# Patient Record
Sex: Female | Born: 1980 | Race: White | Hispanic: No | Marital: Single | Smoking: Former smoker
Health system: Southern US, Community
[De-identification: ages and names within clinical notes are randomized; demographics above are authoritative.]

## PROBLEM LIST (undated history)

## (undated) DIAGNOSIS — H409 Unspecified glaucoma: Secondary | ICD-10-CM

## (undated) DIAGNOSIS — E28319 Asymptomatic premature menopause: Secondary | ICD-10-CM

## (undated) DIAGNOSIS — J302 Other seasonal allergic rhinitis: Secondary | ICD-10-CM

## (undated) HISTORY — DX: Asymptomatic premature menopause: E28.319

## (undated) HISTORY — DX: Other seasonal allergic rhinitis: J30.2

## (undated) HISTORY — PX: GLAUCOMA REPAIR: SHX214

## (undated) HISTORY — DX: Unspecified glaucoma: H40.9

---
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
## 2000-01-14 ENCOUNTER — Other Ambulatory Visit: Admission: RE | Admit: 2000-01-14 | Discharge: 2000-01-14 | Payer: Self-pay | Admitting: Unknown Physician Specialty

## 2000-01-14 ENCOUNTER — Other Ambulatory Visit: Admission: RE | Admit: 2000-01-14 | Discharge: 2000-01-14 | Payer: Self-pay | Admitting: *Deleted

## 2001-07-16 ENCOUNTER — Other Ambulatory Visit: Admission: RE | Admit: 2001-07-16 | Discharge: 2001-07-16 | Payer: Self-pay | Admitting: Obstetrics and Gynecology

## 2002-03-11 ENCOUNTER — Other Ambulatory Visit: Admission: RE | Admit: 2002-03-11 | Discharge: 2002-03-11 | Payer: Self-pay | Admitting: Obstetrics and Gynecology

## 2002-08-13 ENCOUNTER — Other Ambulatory Visit: Admission: RE | Admit: 2002-08-13 | Discharge: 2002-08-13 | Payer: Self-pay | Admitting: Obstetrics and Gynecology

## 2002-09-10 ENCOUNTER — Other Ambulatory Visit: Admission: RE | Admit: 2002-09-10 | Discharge: 2002-09-10 | Payer: Self-pay | Admitting: Obstetrics and Gynecology

## 2003-03-24 ENCOUNTER — Other Ambulatory Visit: Admission: RE | Admit: 2003-03-24 | Discharge: 2003-03-24 | Payer: Self-pay | Admitting: Obstetrics and Gynecology

## 2004-01-26 ENCOUNTER — Other Ambulatory Visit: Admission: RE | Admit: 2004-01-26 | Discharge: 2004-01-26 | Payer: Self-pay | Admitting: Obstetrics and Gynecology

## 2004-09-02 ENCOUNTER — Encounter: Admission: RE | Admit: 2004-09-02 | Discharge: 2004-09-02 | Payer: Self-pay | Admitting: Family Medicine

## 2005-01-19 ENCOUNTER — Encounter: Admission: RE | Admit: 2005-01-19 | Discharge: 2005-01-19 | Payer: Self-pay | Admitting: Obstetrics and Gynecology

## 2005-02-01 ENCOUNTER — Other Ambulatory Visit: Admission: RE | Admit: 2005-02-01 | Discharge: 2005-02-01 | Payer: Self-pay | Admitting: Obstetrics and Gynecology

## 2005-10-14 ENCOUNTER — Encounter (INDEPENDENT_AMBULATORY_CARE_PROVIDER_SITE_OTHER): Payer: Self-pay | Admitting: Pathology

## 2005-10-14 ENCOUNTER — Ambulatory Visit (HOSPITAL_COMMUNITY): Admission: RE | Admit: 2005-10-14 | Discharge: 2005-10-14 | Payer: Self-pay | Admitting: Obstetrics and Gynecology

## 2006-11-06 ENCOUNTER — Ambulatory Visit: Payer: Self-pay | Admitting: Internal Medicine

## 2010-11-02 NOTE — Assessment & Plan Note (Signed)
[facility]                         GASTROENTEROLOGY OFFICE NOTE   NAME:Diana Leach, Diana Leach                    MRN:          [id number]  DATE:11/06/2006                            DOB:          01/17/1981    GASTROENTEROLOGY CONSULTATION   HISTORY OF PRESENT ILLNESS:  Ms. Below is a very nice 30 year old  patient of Dr. Duaine Dredge who comes today to discuss constipation and  abdominal pain.  For the past several months she has had intermittent  left lower and middle quadrant abdominal pain localized anteriorly  associated with occasional low back pain.  Her bowel habits have changed  from regular to having a bowel movement twice a week or up to three  times a week.  She had problems with constipation at the age of 28 or 49  when she was evaluated by a surgeon for possible appendicitis only to  find out that she had constipation.  After becoming constipated in the  last few months, Makara increased her fiber intake.  She was also  taking stool softener.  Her symptoms were quite uncomfortable, being  bloated, having a lot of gas and low back pain.  Her weight has  increased about 25 pounds in the last four years.  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  She does not have any exercise.  She drinks two cups of coffee a day and a lot of water.  She has  recently changed her diet to what sounds like low fat, low carbohydrate  diet which has had markedly positive effect on her bowel habits. She is  now having almost regular bowel movements since her diet change.  She  also points out that in 2022-10-22 her grandfather died and she was under a  little stress.  She subsequently had a bout of viral gastroenteritis and  this was followed by another two weeks of stress at work.   MEDICATIONS:  1. Dicyclomine 20 mg.  She really has not taken any.  2. Folic acid 1 mg daily.   PAST MEDICAL HISTORY:  Heart rhythm problems as a baby.  Asthmatic  bronchitis.  Allergies.   FAMILY HISTORY:  Positive for irritable bowel syndrome in her sister.  There is no history of inflammatory bowel disease or colon cancer.   SOCIAL HISTORY:  She is single.  She lives with her parents.  She works  as an Airline pilot.  She doesn't smoke and doesn't drink alcohol.  She  stopped smoking in January of 2007.   REVIEW OF SYSTEMS:  Positive for recent fever, allergies, skin rashes,  back pain and swollen lymph nodes in her neck.   PHYSICAL EXAMINATION:  VITAL SIGNS: Blood pressure 116/62, pulse 64 and  weight 154 pounds.  GENERAL APPEARANCE:  She is alert, oriented and in no distress. She  appeared healthy.  HEENT:  Sclerae nonicteric.  Oral cavity is normal.  NECK:  Supple with no lymphadenopathy.  LUNGS:  Clear to auscultation.  CARDIOVASCULAR:  Normal S1 and S2.  ABDOMEN:  Soft, not distended, normal active bowel sounds.  I could not  elicit any tenderness which  she reported previously, especially the left  lower quadrant today was unremarkable.  There was no palpable stool or  mass.  Liver edge was at the costal margin.  RECTAL:  On rectal exam the rectal tone was normal.  She had soft  Hemoccult negative stool in the rectal ampulla.   LABORATORY DATA:  Laboratory data from Dr. Geoffery Lyons office showed  normal hemoglobin at 13, hematocrit 32.8, with normal white cell count.  Her serum albumin was 3.9.   IMPRESSION:  This is a 30 year old white female with functional  constipation which has improved secondary to dietary modification.  I  think she needs to increase her exercise and continue on her  carbohydrate-modified diet which seems to be normalizing her bowel  habits.  I have also suggested that she use Activia Yogurt or  Probiotic's to treat excessive gas and bloating which could be related  to bacterial overgrowth which could be, again, related to constipation.  I don't see any indication here to proceed with further gastrointestinal   evaluation, such as colonoscopy or CT scan because she is asymptomatic  and her physical examination, as well as history, are consistent with a  functional constipation.   PLAN:  I have given Guerline a booklet on constipation, also a booklet on  a high fiber diet and gave her advice as mentioned above.  She will come  to see [place] on a p.r.n. basis.  She will followup with Dr. Duaine Dredge for  her routine medical care.     Hedwig Morton. Juanda Chance, MD  Electronically Signed    DMB/MedQ  DD: 11/06/2006  DT: 11/06/2006  Job #: [id number]

## 2010-11-05 NOTE — Op Note (Signed)
Diana Leach, Diana Leach             ACCOUNT NO.:  [id number]   MEDICAL RECORD NO.:  [id number]          PATIENT TYPE:  AMB   LOCATION:  [facility]                           FACILITY:  [facility]   PHYSICIAN:  Duke Salvia. Marcelle Overlie, M.D.DATE OF BIRTH:  May 30, 1981   DATE OF PROCEDURE:  10/14/2005  DATE OF DISCHARGE:                                 OPERATIVE REPORT   PREOPERATIVE DIAGNOSIS:  Recurrent right vulvar skin lesion.   POSTOPERATIVE DIAGNOSIS:  Recurrent right vulvar skin lesion.   PROCEDURE:  Wide excision of vulvar skin lesion.   SURGEON:  [place].   ANESTHESIA:  General plus local.   SPECIMENS REMOVED:  Vulvar skin biopsy.   ESTIMATED BLOOD LOSS:  Minimal.   PROCEDURE AND FINDINGS:  The patient was taken to the operating room.  Initially MAC and local was started.  Because of patient anxiety and  discomfort, the decision was made to proceed under general.  Once she was  comfortable, the area in question was outlined.  It was just lateral to the  right labia minora.  A flattish whitish plaque area that has been  chronically irritated had been biopsied previously.  This had already been  infiltrated with Marcaine 0.5%.  This was excised in an ellipse and  reapproximated easily with interrupted 3-0 Vicryl repeat suture times seven.  This was hemostatic.  She tolerated this well and went to recovery room in  good condition.      Richard M. Marcelle Overlie, M.D.  Electronically Signed     RMH/MEDQ  D:  10/14/2005  T:  10/14/2005  Job:  [id number]

## 2010-12-06 ENCOUNTER — Ambulatory Visit: Payer: Self-pay | Admitting: Internal Medicine

## 2011-03-01 ENCOUNTER — Ambulatory Visit: Payer: Self-pay | Admitting: Physician Assistant

## 2014-07-16 ENCOUNTER — Other Ambulatory Visit: Payer: Self-pay | Admitting: Obstetrics and Gynecology

## 2014-07-17 LAB — CYTOLOGY - PAP

## 2015-10-10 ENCOUNTER — Emergency Department (HOSPITAL_COMMUNITY)
Admission: EM | Admit: 2015-10-10 | Discharge: 2015-10-11 | Disposition: A | Discharge status: Home / Self Care | Payer: BLUE CROSS/BLUE SHIELD | Attending: Emergency Medicine | Admitting: Emergency Medicine

## 2015-10-10 ENCOUNTER — Emergency Department (HOSPITAL_COMMUNITY): Payer: BLUE CROSS/BLUE SHIELD

## 2015-10-10 ENCOUNTER — Encounter (HOSPITAL_COMMUNITY): Payer: Self-pay

## 2015-10-10 DIAGNOSIS — Z3202 Encounter for pregnancy test, result negative: Secondary | ICD-10-CM | POA: Diagnosis not present

## 2015-10-10 DIAGNOSIS — Z23 Encounter for immunization: Secondary | ICD-10-CM | POA: Insufficient documentation

## 2015-10-10 DIAGNOSIS — S51012A Laceration without foreign body of left elbow, initial encounter: Secondary | ICD-10-CM | POA: Insufficient documentation

## 2015-10-10 DIAGNOSIS — W540XXA Bitten by dog, initial encounter: Secondary | ICD-10-CM | POA: Insufficient documentation

## 2015-10-10 DIAGNOSIS — S30811A Abrasion of abdominal wall, initial encounter: Secondary | ICD-10-CM | POA: Insufficient documentation

## 2015-10-10 DIAGNOSIS — Y998 Other external cause status: Secondary | ICD-10-CM | POA: Diagnosis not present

## 2015-10-10 DIAGNOSIS — S50312A Abrasion of left elbow, initial encounter: Secondary | ICD-10-CM | POA: Diagnosis not present

## 2015-10-10 DIAGNOSIS — Y9389 Activity, other specified: Secondary | ICD-10-CM | POA: Diagnosis not present

## 2015-10-10 DIAGNOSIS — Y92009 Unspecified place in unspecified non-institutional (private) residence as the place of occurrence of the external cause: Secondary | ICD-10-CM | POA: Diagnosis not present

## 2015-10-10 DIAGNOSIS — S51052A Open bite, left elbow, initial encounter: Secondary | ICD-10-CM | POA: Insufficient documentation

## 2015-10-10 DIAGNOSIS — Z87891 Personal history of nicotine dependence: Secondary | ICD-10-CM | POA: Diagnosis not present

## 2015-10-10 DIAGNOSIS — S40212A Abrasion of left shoulder, initial encounter: Secondary | ICD-10-CM | POA: Insufficient documentation

## 2015-10-10 LAB — I-STAT BETA HCG BLOOD, ED (MC, WL, AP ONLY): I-stat hCG, quantitative: <5 m[IU]/mL (ref <5)

## 2015-10-10 MED ORDER — AMPICILLIN-SULBACTAM SODIUM 3 (2-1) G IJ SOLR
3.0000 g | Freq: Once | INTRAMUSCULAR | Status: AC
  Administered 2015-10-10: 3 g via INTRAVENOUS
  Filled 2015-10-10: qty 3

## 2015-10-10 MED ORDER — TETANUS-DIPHTH-ACELL PERTUSSIS 5-2.5-18.5 LF-MCG/0.5 IM SUSP
0.5000 mL | Freq: Once | INTRAMUSCULAR | Status: AC
  Administered 2015-10-10: 0.5 mL via INTRAMUSCULAR
  Filled 2015-10-10: qty 0.5

## 2015-10-10 MED ORDER — MORPHINE SULFATE (PF) 4 MG/ML IV SOLN
4.0000 mg | Freq: Once | INTRAVENOUS | Status: AC
  Administered 2015-10-10: 4 mg via INTRAVENOUS
  Filled 2015-10-10: qty 1

## 2015-10-10 MED ORDER — OXYCODONE-ACETAMINOPHEN 5-325 MG PO TABS
1.0000 | ORAL_TABLET | ORAL | Status: DC | PRN
  Administered 2015-10-10: 1 via ORAL
  Filled 2015-10-10: qty 1

## 2015-10-10 MED ORDER — FENTANYL CITRATE (PF) 100 MCG/2ML IJ SOLN
50.0000 ug | INTRAMUSCULAR | Status: DC | PRN
  Administered 2015-10-10: 50 ug via INTRAVENOUS
  Filled 2015-10-10: qty 2

## 2015-10-10 NOTE — ED Provider Notes (Addendum)
CSN: [id number]     Arrival date & time 10/10/15  1731 History   First MD Initiated Contact with Patient 10/10/15 1732     Chief Complaint  Patient presents with  . Animal Bite     (Consider location/radiation/quality/duration/timing/severity/associated sxs/prior Treatment) HPI Comments: 35 y.o. Female presents following a dog bite.  The patient moved into a new house and her neighbor cut her grass and she went to his house to thank him and was attacked by his pitt bull.  Pitt bull is up to date with vaccines.  Patient last had a tetanus shot many years ago.    Patient is a 35 y.o. female presenting with animal bite.  Animal Bite Associated symptoms: no fever and no numbness     No past medical history on file. No past surgical history on file. No family history on file. Social History  Substance Use Topics  . Smoking status: Former Smoker    Types: Cigarettes  . Smokeless tobacco: Not on file  . Alcohol Use: No   OB History    No data available     Review of Systems  Constitutional: Negative for fever and chills.  HENT: Negative for congestion and rhinorrhea.   Eyes: Negative for pain and redness.  Respiratory: Negative for cough and shortness of breath.   Cardiovascular: Negative for chest pain and palpitations.  Gastrointestinal: Negative for nausea, vomiting, abdominal pain and diarrhea.  Genitourinary: Negative for dysuria, urgency and hematuria.  Musculoskeletal: Positive for myalgias. Negative for back pain.  Skin: Positive for wound (multiple).  Neurological: Negative for dizziness, weakness and numbness.  Hematological: Does not bruise/bleed easily.      Allergies  Erythromycin  Home Medications   Prior to Admission medications   Medication Sig Start Date End Date Taking? Authorizing Provider  cyclobenzaprine (FLEXERIL) 5 MG tablet Take 5 mg by mouth 3 (three) times daily as needed for muscle spasms.   Yes Historical Provider, MD  naproxen (NAPROSYN)  500 MG tablet Take 500 mg by mouth daily as needed for moderate pain.  09/24/15  Yes Historical Provider, MD  amoxicillin-clavulanate (AUGMENTIN) 875-125 MG tablet Take 1 tablet by mouth every 12 (twelve) hours. 10/11/15 10/21/15  Leta BaptistEmily Roe Ayaansh Smail, MD  bacitracin ointment Apply 1 application topically 2 (two) times daily. 10/11/15   Leta BaptistEmily Roe Merdith Boyd, MD  oxyCODONE-acetaminophen (PERCOCET/ROXICET) 5-325 MG tablet Take 1 tablet by mouth every 6 (six) hours as needed for severe pain. 10/11/15   Leta BaptistEmily Roe Guilford Shannahan, MD   BP 114/79 mmHg  Pulse 82  Temp(Src) 97.5 F (36.4 C) (Oral)  Resp 16  Ht 5\' 6"  (1.676 m)  Wt 158 lb (71.668 kg)  BMI 25.51 kg/m2  SpO2 98% Physical Exam  Constitutional: She is oriented to person, place, and time. She appears well-developed and well-nourished. No distress.  HENT:  Head: Normocephalic and atraumatic.  Right Ear: External ear normal.  Left Ear: External ear normal.  Nose: Nose normal.  Mouth/Throat: Oropharynx is clear and moist. No oropharyngeal exudate.  Eyes: EOM are normal. Pupils are equal, round, and reactive to light.  Neck: Normal range of motion. Neck supple.  Cardiovascular: Normal rate, regular rhythm, normal heart sounds and intact distal pulses.   No murmur heard. Pulmonary/Chest: Effort normal. No respiratory distress. She has no wheezes. She has no rales.  Abdominal: Soft. She exhibits no distension. There is no tenderness.  Musculoskeletal: Normal range of motion. She exhibits edema (around the left elbow). She exhibits no tenderness.  Left elbow: She exhibits laceration. She exhibits normal range of motion, no effusion and no deformity. No tenderness found.  Neurological: She is alert and oriented to person, place, and time. She has normal strength. No sensory deficit.  Skin: Skin is warm and dry. No rash noted. She is not diaphoretic.     Multiple teeth marks and abrasions over the patient's body as depicted in the diagram. Most notably on  the back of the left shoulder, around the left elbow, at the right groin.  Vitals reviewed.   ED Course  .Marland KitchenLaceration Repair Date/Time: 10/10/2015 11:41 PM Performed by: Tyrone Apple ROE Authorized by: Leta Baptist Consent: Verbal consent obtained. Risks and benefits: risks, benefits and alternatives were discussed Consent given by: patient Body area: upper extremity Location details: left elbow Laceration length: 1 cm Foreign bodies: no foreign bodies Tendon involvement: none Nerve involvement: none Vascular damage: no Anesthesia: local infiltration Local anesthetic: lidocaine 1% without epinephrine Irrigation solution: saline Irrigation method: syringe Skin closure: 4-0 nylon Number of sutures: 1 Approximation difficulty: simple Patient tolerance: Patient tolerated the procedure well with no immediate complications  .Marland KitchenLaceration Repair Date/Time: 10/10/2015 11:43 PM Performed by: Tyrone Apple ROE Authorized by: Leta Baptist Consent: Verbal consent obtained. Risks and benefits: risks, benefits and alternatives were discussed Consent given by: patient Body area: upper extremity Location details: left elbow Laceration length: 1.5 cm Foreign bodies: no foreign bodies Tendon involvement: none Nerve involvement: none Vascular damage: no Anesthesia: local infiltration Local anesthetic: lidocaine 1% without epinephrine Patient sedated: no Irrigation solution: saline Irrigation method: syringe Skin closure: 4-0 nylon Number of sutures: 2 Technique: simple Approximation difficulty: simple Patient tolerance: Patient tolerated the procedure well with no immediate complications   (including critical care time) Labs Review Labs Reviewed  I-STAT BETA HCG BLOOD, ED ([facility], [facility], [facility] ONLY)    Imaging Review Dg Elbow Complete Left  10/10/2015  CLINICAL DATA:  Dog bite to left elbow, multiple lacerations EXAM: LEFT ELBOW - COMPLETE 3+ VIEW COMPARISON:  None. FINDINGS:  No fracture or dislocation is seen. The joint spaces are preserved. Mild soft tissue swelling/lucencies along the radial aspect of the elbow, likely corresponding to known lacerations. No radiopaque foreign body is seen. IMPRESSION: Soft tissue swelling/lacerations along the radial aspect of the elbow. No fracture, dislocation, or radiopaque foreign body is seen. Electronically Signed   By: Charline Bills M.D.   On: 10/10/2015 22:17   I have personally reviewed and evaluated these images and lab results as part of my medical decision-making.   EKG Interpretation None      MDM  Patient was seen and evaluated in stable condition. Patient was given booster ask for tetanus vaccination. Patient was given Percocet for pain control. Patient was also given a dose of IV Unasyn for antibiotic prophylactic. Patient with multiple abrasions over her body as well as lacerations. There were 2 larger gaping lacerations about her elbow. A single stitch was put in one to approximate the skin but to leave open able to drain. 2 stitches were put into the other one. It was discussed with patient that closing lacerations especially closing them tightly after animal bite puts you at increased risk for infection she had wanted these to to be better approximated. Patient was discharged home in stable condition with prescriptions for Augmentin, Percocet, bacitracin. She was instructed to follow-up in 5-7 days with the primary care physician for wound recheck and suture removal. She was given strict return precautions. Final diagnoses:  Dog  bite    1. Dog bite    Leta Baptist, MD 10/11/15 1610  Leta Baptist, MD 10/11/15 [phone]

## 2015-10-10 NOTE — ED Notes (Signed)
Cleaned pt.'s wounds on left arm, back of left shoulder, and right groin. Pt. Is resting on stretcher with no signs of distress.

## 2015-10-10 NOTE — ED Notes (Signed)
Pt. Coming from home via [facility] c/o dog bite today. Pt. Going to neighbors house and his 2 pitbulls ran up and attacked her. Pt. Noted to have puncture marks to left elbow and forearm and abrasions to left flank area and right hip area. Pt. sts her pain is 6/10. Pt. Tearful at this time. Pt. Given ns en route. Pt. Denies LOC or hitting her head.

## 2015-10-11 MED ORDER — LIDOCAINE HCL (PF) 1 % IJ SOLN
10.0000 mL | Freq: Once | INTRAMUSCULAR | Status: AC
  Administered 2015-10-11: 10 mL
  Filled 2015-10-11: qty 10

## 2015-10-11 MED ORDER — AMOXICILLIN-POT CLAVULANATE 875-125 MG PO TABS: 1.0000 | ORAL_TABLET | Freq: Two times a day (BID) | ORAL | Status: AC

## 2015-10-11 MED ORDER — BACITRACIN ZINC 500 UNIT/GM EX OINT: 1.0000 "application " | TOPICAL_OINTMENT | Freq: Two times a day (BID) | CUTANEOUS | Status: DC

## 2015-10-11 MED ORDER — OXYCODONE-ACETAMINOPHEN 5-325 MG PO TABS: 1.0000 | ORAL_TABLET | Freq: Four times a day (QID) | ORAL | Status: DC | PRN

## 2015-10-11 NOTE — Discharge Instructions (Signed)
You were seen and evaluated today for your multiple abrasions and bite wounds. Keep the areas clean and dry. Use water and regular soap. Keep your wounds dressed. Follow-up outpatient for wound check and suture removal in 5-7 days. Seek immediate medical attention if the areas start to appear infected. Take the antibiotic prescribed. Keep your left arm elevated and apply ice to the elbow area.  Animal Bite Animal bites can range from mild to serious. An animal bite can result in a scratch on the skin, a deep open cut, a puncture of the skin, a crush injury, or tearing away of the skin or a body part. A small bite from a house pet will usually not cause serious problems. However, some animal bites can become infected or injure a bone or other tissue.  Bites from certain animals can be more dangerous because of the risk of spreading rabies, which is a serious viral infection. This risk is higher with bites from stray animals or wild animals, such as raccoons, foxes, skunks, and bats. Dogs are responsible for most animal bites. Children are bitten more often than adults. SYMPTOMS  Common symptoms of an animal bite include:   Pain.   Bleeding.   Swelling.   Bruising.  DIAGNOSIS  This condition may be diagnosed based on a physical exam and medical history. Your health care provider will examine the wound and ask for details about the animal and how the bite happened. You may also have tests, such as:   Blood tests to check for infection or to determine if surgery is needed.  X-rays to check for damage to bones or joints.  Culture test. This uses a sample of fluid from the wound to check for infection. TREATMENT  Treatment varies depending on the location and type of animal bite and your medical history. Treatment may include:   Wound care. This often includes cleaning the wound, flushing the wound with saline solution, and applying a bandage (dressing). Sometimes, the wound is left open to  heal because of the high risk of infection. However, in some cases, the wound may be closed with stitches (sutures), staples, skin glue, or adhesive strips.   Antibiotic medicine.   Tetanus shot.   Rabies treatment if the animal could have rabies.  In some cases, bites that have become infected may require IV antibiotics and surgical treatment in the hospital.  HOME CARE INSTRUCTIONS Wound Care  Follow instructions from your health care provider about how to take care of your wound. Make sure you:  Wash your hands with soap and water before you change your dressing. If soap and water are not available, use hand sanitizer.  Change your dressing as told by your health care provider.  Leave sutures, skin glue, or adhesive strips in place. These skin closures may need to be in place for 2 weeks or longer. If adhesive strip edges start to loosen and curl up, you may trim the loose edges. Do not remove adhesive strips completely unless your health care provider tells you to do that.  Check your wound every day for signs of infection. Watch for:   Increasing redness, swelling, or pain.   Fluid, blood, or pus.  General Instructions  Take or apply over-the-counter and prescription medicines only as told by your health care provider.   If you were prescribed an antibiotic, take or apply it as told by your health care provider. Do not stop using the antibiotic even if your condition improves.  Keep the injured area raised (elevated) above the level of your heart while you are sitting or lying down, if this is possible.   If directed, apply ice to the injured area.   Put ice in a plastic bag.   Place a towel between your skin and the bag.   Leave the ice on for 20 minutes, 2-3 times per day.   Keep all follow-up visits as told by your health care provider. This is important.  SEEK MEDICAL CARE IF:  You have increasing redness, swelling, or pain at the site of  your wound.   You have a general feeling of sickness (malaise).   You feel nauseous or you vomit.   You have pain that does not get better.  SEEK IMMEDIATE MEDICAL CARE IF:  You have a red streak extending away from your wound.   You have fluid, blood, or pus coming from your wound.   You have a fever or chills.   You have trouble moving your injured area.   You have numbness or tingling extending beyond the wound.   This information is not intended to replace advice given to you by your health care provider. Make sure you discuss any questions you have with your health care provider.   Document Released: 02/22/2011 Document Revised: 02/25/2015 Document Reviewed: 10/22/2014 Elsevier Interactive Patient Education [employer]2016 [employer]vier Inc.

## 2015-10-11 NOTE — ED Notes (Signed)
Lidocaine readily available, suture cart also for MD.

## 2016-05-09 ENCOUNTER — Ambulatory Visit (INDEPENDENT_AMBULATORY_CARE_PROVIDER_SITE_OTHER): Payer: Self-pay

## 2016-05-09 ENCOUNTER — Encounter (INDEPENDENT_AMBULATORY_CARE_PROVIDER_SITE_OTHER): Payer: Self-pay | Admitting: Orthopaedic Surgery

## 2016-05-09 ENCOUNTER — Ambulatory Visit (INDEPENDENT_AMBULATORY_CARE_PROVIDER_SITE_OTHER): Payer: BLUE CROSS/BLUE SHIELD | Admitting: Orthopaedic Surgery

## 2016-05-09 DIAGNOSIS — G8929 Other chronic pain: Secondary | ICD-10-CM

## 2016-05-09 DIAGNOSIS — M25512 Pain in left shoulder: Secondary | ICD-10-CM

## 2016-05-09 DIAGNOSIS — M25511 Pain in right shoulder: Secondary | ICD-10-CM | POA: Diagnosis not present

## 2016-05-09 DIAGNOSIS — M25522 Pain in left elbow: Secondary | ICD-10-CM

## 2016-05-09 DIAGNOSIS — M25521 Pain in right elbow: Secondary | ICD-10-CM | POA: Diagnosis not present

## 2016-05-09 MED ORDER — METHYLPREDNISOLONE ACETATE 40 MG/ML IJ SUSP
40.0000 mg | INTRAMUSCULAR | Status: AC | PRN
  Administered 2016-05-09: 40 mg via INTRA_ARTICULAR

## 2016-05-09 MED ORDER — LIDOCAINE HCL 1 % IJ SOLN
1.0000 mL | INTRAMUSCULAR | Status: AC | PRN
  Administered 2016-05-09: 1 mL

## 2016-05-09 MED ORDER — METHYLPREDNISOLONE ACETATE 40 MG/ML IJ SUSP
40.0000 mg | INTRAMUSCULAR | Status: AC | PRN
  Administered 2016-05-09: 40 mg

## 2016-05-09 NOTE — Addendum Note (Signed)
Addended by: Rogers SeedsYEATTS, Fedora Knisely M on: 05/09/2016 05:37 PM   Modules accepted: Orders

## 2016-05-09 NOTE — Progress Notes (Signed)
Office Visit Note   Patient: Marlaine HindBrendle E Boshers           Date of Birth: 11/24/1980           MRN: [id number] Visit Date: 05/09/2016              Requested by: Martha ClanWilliam Shaw, MD [address] [place], [place] [id number] PCP: No PCP Per Patient   Assessment & Plan: Visit Diagnoses:  1. Chronic right shoulder pain   2. Pain in right elbow   3. Chronic left shoulder pain   4. Pain in left elbow     Plan: Tolerated the subacromial injection in her left shoulder and the injection in her left lateral epicondylar area quite well. Given the pain and weakness in her left shoulder combined with some slight muscle atrophy and MRI is definitely warranted of the shoulder to rule out a rotator cuff tear. Even the extent of her attack on that left upper extremity combined with continued pain after 7 months post trauma and due to weakness in her shoulder and MRI is warranted to rule out a rotator cuff tear. We will see her back after the MRI.  Follow-Up Instructions: Return in about 4 weeks (around 06/06/2016).   Orders:  Orders Placed This Encounter  Procedures  . Large Joint Injection/Arthrocentesis  . Hand/Upper Extremity Injection/Arthrocentesis  . XR Shoulder Left  . XR Elbow 2 Views Left   No orders of the defined types were placed in this encounter.     Procedures: Large Joint Inj Date/Time: 05/09/2016 5:19 PM Performed by: Kathryne HitchBLACKMAN, CHRISTOPHER Y Authorized by: Doneen PoissonBLACKMAN, CHRISTOPHER Y   Indications:  Pain Location:  Shoulder Site:  L subacromial bursa Medications:  1 mL lidocaine 1 %; 40 mg methylPREDNISolone acetate 40 MG/ML Hand/UE Inj Date/Time: 05/09/2016 5:20 PM Performed by: Kathryne HitchBLACKMAN, CHRISTOPHER Y Authorized by: Kathryne HitchBLACKMAN, CHRISTOPHER Y   Indications:  Pain Condition: lateral epicondylitis   Site:  L elbow Approach:  Lateral Medications:  1 mL lidocaine 1 %; 40 mg methylPREDNISolone acetate 40 MG/ML     Clinical Data: No additional  findings.   Subjective: Chief Complaint  Patient presents with  . Left Shoulder - Pain  . Left Elbow - Pain    Georgiana SpinnerBrendle is here for her left shoulder and left elbow pain. 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  As far shoulder she is having residual pain, and in elbow she has residual pain, swelling and inflammation it.  Per Dr. Clelia CroftShaw he wanted to be seen in our office since this has been on going. +6  This pain does wake her up at night and she has considerable weakness she feels her entire left upper extremity. It's been over 6 months since her original trauma  Review of Systems Negative for headache, chest pain, shortness of breath, fever, chills, nausea, vomiting  Objective: Vital Signs: LMP 05/03/2016   Physical Exam He is alert and oriented 3 in no acute distress Ortho Exam Emanation of her left shoulder shows some slight atrophy in the posterior muscles of the shoulder but is only slight. She has obvious bite marks from the trauma that was done months ago. She has pain with external rotation internal rotation with adduction and abduction of her shoulder and her some clicking in the shoulder itself. She also has pain over the left elbow lateral epicondylar area and multiple bite wounds on the lower arm and forearm as well. Specialty  Comments:  No specialty comments available.  Imaging: Xr Elbow 2 Views Left  Result Date: 05/09/2016 An AP and lateral of the left elbow shows a normal-appearing elbow joint with no acute bone irregularities  Xr Shoulder Left  Result Date: 05/09/2016 An AP and lateral of her left shoulder including oblique view showed no abnormal findings.    PMFS History: There are no active problems to display for this patient.  No past medical history on file.  No family history on file.  No past surgical history on file. Social History   Occupational History  . Not on file.   Social History Main Topics   . Smoking status: Former Smoker    Types: Cigarettes  . Smokeless tobacco: Not on file  . Alcohol use No  . Drug use: Unknown  . Sexual activity: Not on file

## 2016-05-30 ENCOUNTER — Ambulatory Visit
Admission: RE | Admit: 2016-05-30 | Discharge: 2016-05-30 | Disposition: A | Discharge status: Home / Self Care | Payer: BLUE CROSS/BLUE SHIELD | Source: Ambulatory Visit | Attending: Orthopaedic Surgery | Admitting: Orthopaedic Surgery

## 2016-05-30 DIAGNOSIS — M25512 Pain in left shoulder: Principal | ICD-10-CM

## 2016-05-30 DIAGNOSIS — G8929 Other chronic pain: Secondary | ICD-10-CM

## 2016-06-06 ENCOUNTER — Ambulatory Visit (INDEPENDENT_AMBULATORY_CARE_PROVIDER_SITE_OTHER): Payer: BLUE CROSS/BLUE SHIELD | Admitting: Orthopaedic Surgery

## 2016-06-06 DIAGNOSIS — G8929 Other chronic pain: Secondary | ICD-10-CM | POA: Diagnosis not present

## 2016-06-06 DIAGNOSIS — M25512 Pain in left shoulder: Secondary | ICD-10-CM | POA: Diagnosis not present

## 2016-06-06 NOTE — Progress Notes (Signed)
The patient is following up for review of an MRI of her left shoulder. We also provided a steroid injection subacromial space and her shoulder at her last visit. She is someone who was attacked by a dog and pulled down that left arm in April 2017. She's been having problems in her shoulder and elbow. I injected both areas and she said the injection helped about 70% she's feeling much better overall. However I did obtain an MRI cholesterol weakness in the shoulder muscles and shoulder girdle in general.  The MRI is reviewed with her and it does show atrophy of the teres minor muscle suggesting denervation type of injury. This is consistent with the trauma that she describes. She otherwise has excellent range of motion of her shoulder exam today and some mild pain.  At this point I told her that most likely she'll have complete resolution of her symptoms. I told her we can always inject her shoulder again in the spring if is bothering her enough. She'll let us know he gives a call if it becomes an issue for her.

## 2016-08-22 ENCOUNTER — Ambulatory Visit (INDEPENDENT_AMBULATORY_CARE_PROVIDER_SITE_OTHER): Payer: BLUE CROSS/BLUE SHIELD | Admitting: Orthopaedic Surgery

## 2016-08-22 DIAGNOSIS — M7712 Lateral epicondylitis, left elbow: Secondary | ICD-10-CM | POA: Diagnosis not present

## 2016-08-22 DIAGNOSIS — G8929 Other chronic pain: Secondary | ICD-10-CM

## 2016-08-22 DIAGNOSIS — M25512 Pain in left shoulder: Secondary | ICD-10-CM | POA: Diagnosis not present

## 2016-08-22 MED ORDER — METHYLPREDNISOLONE ACETATE 40 MG/ML IJ SUSP
40.0000 mg | INTRAMUSCULAR | Status: AC | PRN
  Administered 2016-08-22: 40 mg

## 2016-08-22 MED ORDER — LIDOCAINE HCL 1 % IJ SOLN
3.0000 mL | INTRAMUSCULAR | Status: AC | PRN
  Administered 2016-08-22: 3 mL

## 2016-08-22 MED ORDER — METHYLPREDNISOLONE ACETATE 40 MG/ML IJ SUSP
40.0000 mg | INTRAMUSCULAR | Status: AC | PRN
  Administered 2016-08-22: 40 mg via INTRA_ARTICULAR

## 2016-08-22 NOTE — Progress Notes (Signed)
   Office Visit Note   Patient: Diana Leach           Date of Birth: Sep 17, 1980           MRN: [id number] Visit Date: 08/22/2016              Requested by: No referring provider defined for this encounter. PCP: No PCP Per Patient   Assessment & Plan: Visit Diagnoses:  1. Chronic left shoulder pain   2. Lateral epicondylitis, left elbow     Plan: She tolerated injections in her left shoulder subacromial space and her left elbow epicondylar area very well. These were both steroid and lidocaine. She'll work on stretching exercises and she does have a topical anti-inflammatory on she hasn't used. I instructed her to how to use these. She'll follow-up as needed  Follow-Up Instructions: Return if symptoms worsen or fail to improve.   Orders:  Orders Placed This Encounter  Procedures  . Large Joint Injection/Arthrocentesis  . Hand/Upper Extremity Injection/Arthrocentesis   No orders of the defined types were placed in this encounter.     Procedures: Large Joint Inj Date/Time: 08/22/2016 3:34 PM Performed by: Kathryne HitchBLACKMAN, Iara Monds Y Authorized by: Doneen PoissonBLACKMAN, Ansley Mangiapane Y   Location:  Shoulder Site:  L subacromial bursa Ultrasound Guidance: No   Fluoroscopic Guidance: No   Arthrogram: No   Medications:  3 mL lidocaine 1 %; 40 mg methylPREDNISolone acetate 40 MG/ML Hand/UE Inj Date/Time: 08/22/2016 3:34 PM Performed by: Kathryne HitchBLACKMAN, Kaylib Furness Y Authorized by: Kathryne HitchBLACKMAN, Jaylee Freeze Y   Condition: lateral epicondylitis   Medications:  40 mg methylPREDNISolone acetate 40 MG/ML     Clinical Data: No additional findings.   Subjective: No chief complaint on file. The patient is well-known to me. She is following up after injuries to her left shoulder and left elbow from a dog bite that occurred in April 2017. We did obtain an MRI of her shoulder showed some atrophy of the teres minor muscle. Both her shoulder and her elbow been bothering her some the elbows get lateral  epicondylitis but this is around where she had bites as well. Injections helped quite a bit but she like to have them again today because the elbow is really flared up more so the shoulder but she feels like she is making great progress  HPI  Review of Systems She denies any fever chills or nausea vomiting or any recent illnesses  Objective: Vital Signs: There were no vitals taken for this visit.  Physical Exam He is alert and oriented 3 in no acute distress Ortho Exam Examination of her left elbow shows well-healed dog bite wounds. She has pain over the lateral epicondyle and the ECRB. Her left shoulder has fluid range of motion which is a little bit of pain in the subacromial area. Specialty Comments:  No specialty comments available.  Imaging: No results found.   PMFS History: Patient Active Problem List   Diagnosis Date Noted  . Chronic left shoulder pain 08/22/2016  . Lateral epicondylitis, left elbow 08/22/2016   No past medical history on file.  No family history on file.  No past surgical history on file. Social History   Occupational History  . Not on file.   Social History Main Topics  . Smoking status: Former Smoker    Types: Cigarettes  . Smokeless tobacco: Not on file  . Alcohol use No  . Drug use: Unknown  . Sexual activity: Not on file

## 2016-09-09 ENCOUNTER — Telehealth (INDEPENDENT_AMBULATORY_CARE_PROVIDER_SITE_OTHER): Payer: Self-pay | Admitting: Orthopaedic Surgery

## 2016-09-09 NOTE — Telephone Encounter (Signed)
PT IS ASKING IF WE CAN GIVE HER A REFERRAL FOR PT TO STRENGTHEN HER LEFT ARM.  [phone]

## 2016-09-12 NOTE — Telephone Encounter (Signed)
Please advise 

## 2016-09-12 NOTE — Telephone Encounter (Signed)
Faxed to [facility], they will call patient and schedule

## 2016-09-12 NOTE — Telephone Encounter (Signed)
See script for outpatient PT

## 2016-11-01 ENCOUNTER — Ambulatory Visit (INDEPENDENT_AMBULATORY_CARE_PROVIDER_SITE_OTHER): Payer: BLUE CROSS/BLUE SHIELD | Admitting: Orthopaedic Surgery

## 2016-11-01 ENCOUNTER — Other Ambulatory Visit (INDEPENDENT_AMBULATORY_CARE_PROVIDER_SITE_OTHER): Payer: Self-pay

## 2016-11-01 DIAGNOSIS — M25522 Pain in left elbow: Secondary | ICD-10-CM

## 2016-11-01 DIAGNOSIS — M7712 Lateral epicondylitis, left elbow: Secondary | ICD-10-CM | POA: Diagnosis not present

## 2016-11-01 NOTE — Progress Notes (Signed)
The patient is well-known to me. She had significant left upper extremity trauma from a dog bite last year. After the dog bite healed she had problems with lateral epicondylitis of the elbow. We've injected that area twice. She comes in today because and unfortunately she's had adverse reaction to steroid injections.  On examination she actually has skin changes consistent with steroid injection in but there is also atrophy in this area almost as if there is adipose tissue necrosis. She has full range of motion of her elbow. She is tender over the lateral epicondylar area but there is no evidence infection. She can flex and extend her wrist easily and she has full strength around the elbow and wrist muscles.  I would like to MRI the elbow at this point to assess the extent of atrophy and the anatomy in this elbow though area in general. This is again on the left side. I gave her reassurance that uses treatment is just observation but is concerning enough to obtain an MRI.

## 2016-11-02 ENCOUNTER — Telehealth (INDEPENDENT_AMBULATORY_CARE_PROVIDER_SITE_OTHER): Payer: Self-pay | Admitting: Orthopaedic Surgery

## 2016-11-02 NOTE — Telephone Encounter (Signed)
Tresa EndoKelly @ Dr. Alver FisherShaw's office called for ov notes. I faxed to her [phone]. Ph# [phone]

## 2016-11-15 DIAGNOSIS — H40033 Anatomical narrow angle, bilateral: Secondary | ICD-10-CM | POA: Diagnosis not present

## 2016-11-15 DIAGNOSIS — H5202 Hypermetropia, left eye: Secondary | ICD-10-CM | POA: Diagnosis not present

## 2016-11-15 DIAGNOSIS — H5211 Myopia, right eye: Secondary | ICD-10-CM | POA: Diagnosis not present

## 2016-11-16 ENCOUNTER — Ambulatory Visit
Admission: RE | Admit: 2016-11-16 | Discharge: 2016-11-16 | Disposition: A | Discharge status: Home / Self Care | Payer: BLUE CROSS/BLUE SHIELD | Source: Ambulatory Visit | Attending: Orthopaedic Surgery | Admitting: Orthopaedic Surgery

## 2016-11-16 DIAGNOSIS — M25522 Pain in left elbow: Secondary | ICD-10-CM

## 2016-11-18 DIAGNOSIS — H40003 Preglaucoma, unspecified, bilateral: Secondary | ICD-10-CM | POA: Diagnosis not present

## 2016-11-21 ENCOUNTER — Ambulatory Visit (INDEPENDENT_AMBULATORY_CARE_PROVIDER_SITE_OTHER): Payer: BLUE CROSS/BLUE SHIELD | Admitting: Orthopaedic Surgery

## 2016-11-21 ENCOUNTER — Encounter (INDEPENDENT_AMBULATORY_CARE_PROVIDER_SITE_OTHER): Payer: Self-pay | Admitting: Orthopaedic Surgery

## 2016-11-21 DIAGNOSIS — M7712 Lateral epicondylitis, left elbow: Secondary | ICD-10-CM

## 2016-11-21 NOTE — Progress Notes (Signed)
The patient is very pleasant 36 year old female that we have been seeing for left upper extremity trauma is related to attack by a dog. She had several bite wounds around her elbow and forearm area but no significant damage to the soft tissue that we can tell on clinical exam and no evidence infection. After some time we eventually diagnosed her with lateral epicondylitis as well and felt that a steroid injection would be helpful. We placed a steroid injection without difficulty and eventually over time place a second injection. Following the second injection she did have a reaction there was definitely adverse. She developed atrophy of the soft tissue around the lateral epicondylar area and some changes to the skin. She is following up today after having an MRI of his elbow is only sure there was no significant damage to the elbow in general.  On examination there is still evidence of atrophy of the soft tissue but the skin appears to be slowly improving. She's got excellent function of the elbow is still some slight tenderness. MRI is interesting for the fact that it does show adipose tissue necrosis in the area of the injection. All the remaining structures including muscle, tendon, ligaments and bone appeared normal. This is a finding that is definitely consistent with atrophy the can be caused by a steroid injection although it is significantly rare.  I did let her know that the literature demonstrates that recovery can be had a situation such as this but he can take 6 months to a year. She understands this fully and all questions were encouraged and answered. I would like to see her back in a year for repeat exam. I would definitely not place a steroid injection in this area again but I do feel that a steroid injection if ever needed in any other area of her body would likely not cause the same  response.

## 2016-11-22 ENCOUNTER — Telehealth (INDEPENDENT_AMBULATORY_CARE_PROVIDER_SITE_OTHER): Payer: Self-pay | Admitting: Orthopaedic Surgery

## 2016-11-22 NOTE — Telephone Encounter (Signed)
Copy of records mailed to patient. Also, faxed copy to Dr. Clelia CroftShaw

## 2016-12-02 DIAGNOSIS — H40003 Preglaucoma, unspecified, bilateral: Secondary | ICD-10-CM | POA: Diagnosis not present

## 2016-12-23 DIAGNOSIS — S46812A Strain of other muscles, fascia and tendons at shoulder and upper arm level, left arm, initial encounter: Secondary | ICD-10-CM | POA: Diagnosis not present

## 2017-01-30 DIAGNOSIS — F4321 Adjustment disorder with depressed mood: Secondary | ICD-10-CM | POA: Diagnosis not present

## 2017-02-06 DIAGNOSIS — F4321 Adjustment disorder with depressed mood: Secondary | ICD-10-CM | POA: Diagnosis not present

## 2017-02-22 DIAGNOSIS — F4321 Adjustment disorder with depressed mood: Secondary | ICD-10-CM | POA: Diagnosis not present

## 2017-03-10 DIAGNOSIS — F4321 Adjustment disorder with depressed mood: Secondary | ICD-10-CM | POA: Diagnosis not present

## 2017-03-24 DIAGNOSIS — F4321 Adjustment disorder with depressed mood: Secondary | ICD-10-CM | POA: Diagnosis not present

## 2017-04-05 DIAGNOSIS — F4321 Adjustment disorder with depressed mood: Secondary | ICD-10-CM | POA: Diagnosis not present

## 2017-04-21 DIAGNOSIS — F4321 Adjustment disorder with depressed mood: Secondary | ICD-10-CM | POA: Diagnosis not present

## 2017-05-05 DIAGNOSIS — F4321 Adjustment disorder with depressed mood: Secondary | ICD-10-CM | POA: Diagnosis not present

## 2017-05-15 DIAGNOSIS — F4321 Adjustment disorder with depressed mood: Secondary | ICD-10-CM | POA: Diagnosis not present

## 2017-06-02 DIAGNOSIS — F4321 Adjustment disorder with depressed mood: Secondary | ICD-10-CM | POA: Diagnosis not present

## 2017-06-16 DIAGNOSIS — F4321 Adjustment disorder with depressed mood: Secondary | ICD-10-CM | POA: Diagnosis not present

## 2017-07-03 DIAGNOSIS — F4321 Adjustment disorder with depressed mood: Secondary | ICD-10-CM | POA: Diagnosis not present

## 2017-07-17 DIAGNOSIS — F4321 Adjustment disorder with depressed mood: Secondary | ICD-10-CM | POA: Diagnosis not present

## 2017-07-31 DIAGNOSIS — F4321 Adjustment disorder with depressed mood: Secondary | ICD-10-CM | POA: Diagnosis not present

## 2017-08-14 DIAGNOSIS — F4321 Adjustment disorder with depressed mood: Secondary | ICD-10-CM | POA: Diagnosis not present

## 2017-08-28 DIAGNOSIS — F4321 Adjustment disorder with depressed mood: Secondary | ICD-10-CM | POA: Diagnosis not present

## 2017-09-05 DIAGNOSIS — Z6827 Body mass index (BMI) 27.0-27.9, adult: Secondary | ICD-10-CM | POA: Diagnosis not present

## 2017-09-05 DIAGNOSIS — Z01419 Encounter for gynecological examination (general) (routine) without abnormal findings: Secondary | ICD-10-CM | POA: Diagnosis not present

## 2017-09-11 DIAGNOSIS — F4321 Adjustment disorder with depressed mood: Secondary | ICD-10-CM | POA: Diagnosis not present

## 2017-09-25 DIAGNOSIS — F4321 Adjustment disorder with depressed mood: Secondary | ICD-10-CM | POA: Diagnosis not present

## 2017-10-23 DIAGNOSIS — F4321 Adjustment disorder with depressed mood: Secondary | ICD-10-CM | POA: Diagnosis not present

## 2017-11-06 DIAGNOSIS — F4321 Adjustment disorder with depressed mood: Secondary | ICD-10-CM | POA: Diagnosis not present

## 2017-11-20 DIAGNOSIS — F4321 Adjustment disorder with depressed mood: Secondary | ICD-10-CM | POA: Diagnosis not present

## 2017-12-04 DIAGNOSIS — F4321 Adjustment disorder with depressed mood: Secondary | ICD-10-CM | POA: Diagnosis not present

## 2017-12-18 DIAGNOSIS — F4321 Adjustment disorder with depressed mood: Secondary | ICD-10-CM | POA: Diagnosis not present

## 2017-12-29 DIAGNOSIS — H5203 Hypermetropia, bilateral: Secondary | ICD-10-CM | POA: Diagnosis not present

## 2017-12-29 DIAGNOSIS — H40033 Anatomical narrow angle, bilateral: Secondary | ICD-10-CM | POA: Diagnosis not present

## 2018-01-01 DIAGNOSIS — F4321 Adjustment disorder with depressed mood: Secondary | ICD-10-CM | POA: Diagnosis not present

## 2018-01-30 DIAGNOSIS — F4321 Adjustment disorder with depressed mood: Secondary | ICD-10-CM | POA: Diagnosis not present

## 2018-02-09 IMAGING — MR MR ELBOW*L* W/O CM
4 series · 40 of 40 positions shown · non-contrast
Comparison: Radiographs of the left elbow from 10/10/2015

CLINICAL DATA: Skin atrophy after cortisone injection along the
posterolateral aspect of the elbow for pain performed [REDACTED]. Patient sustained a typical bite to the left elbow 1 year ago
with anterior scarring along the medial aspect.

EXAM:
MRI OF THE LEFT ELBOW WITHOUT CONTRAST
TECHNIQUE: Multiplanar, multisequence MR imaging of the elbow was performed. No
intravenous contrast was administered.

[Series 3: T1 · axial · left · 3.0mm · 0.44mm/px · z∈[-54,+89]mm · 12 of 43 slices shown]
[im 1/43]
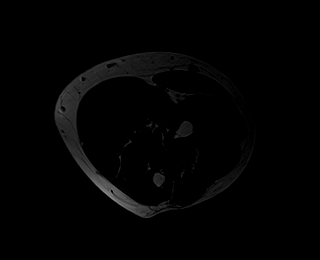
[im 4/43]
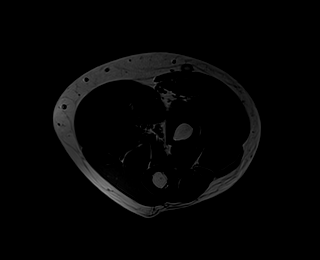
[im 8/43]
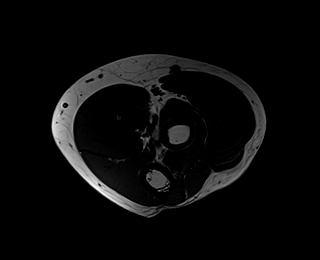
[im 12/43]
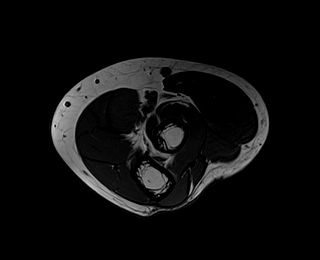
[im 16/43]
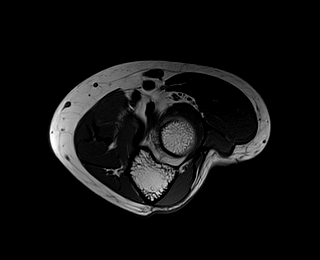
[im 20/43]
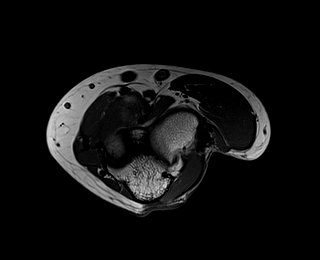
[im 23/43]
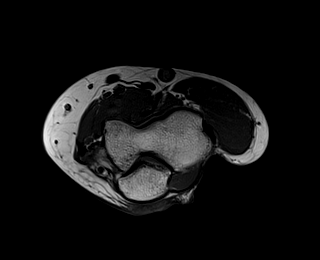
[im 27/43]
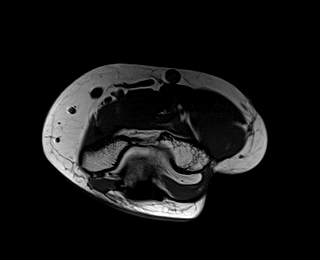
[im 31/43]
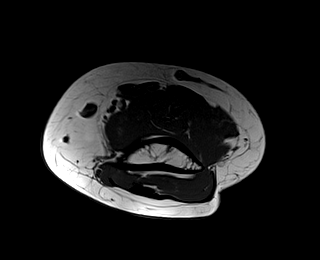
[im 35/43]
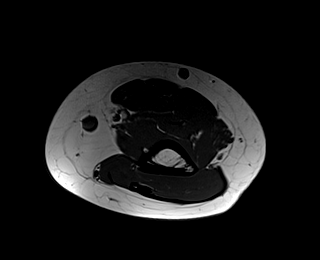
[im 39/43]
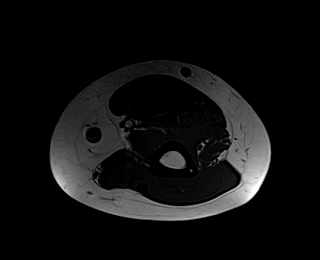
[im 43/43]
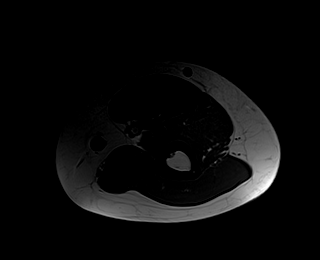

[Series 4: T2 fat-sat · axial · left · 3.0mm · 0.44mm/px · z∈[-53,+89]mm · 12 of 43 slices shown (1 of 2)]
[im 1/43]
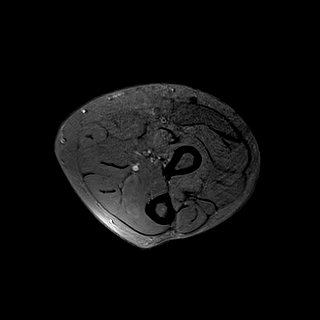
[im 4/43]
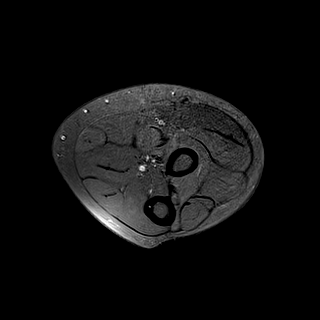
[im 8/43]
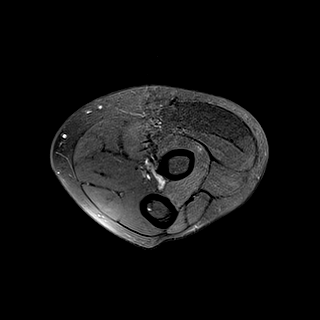
[im 12/43]
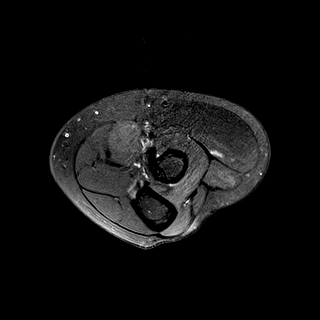
[im 16/43]
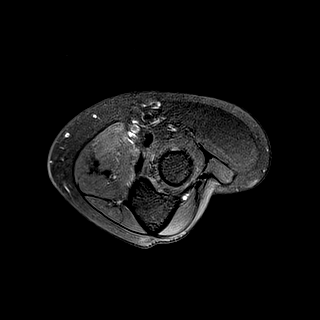
[im 20/43]
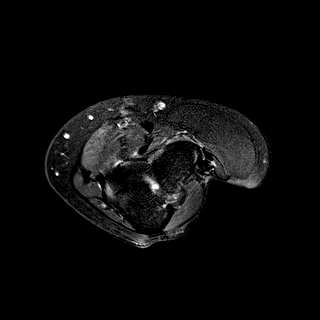
[im 23/43]
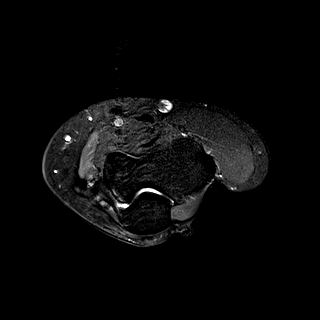
[im 27/43]
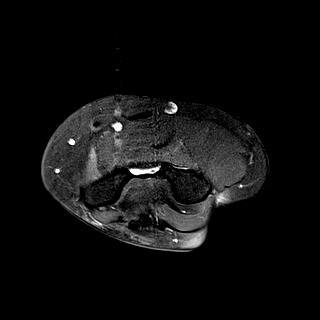
[im 31/43]
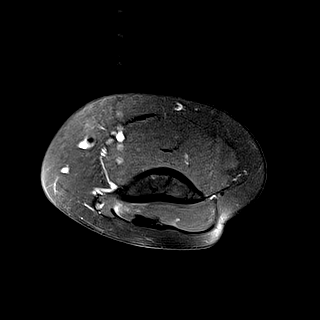
[im 35/43]
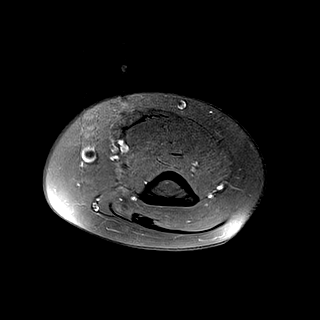
[im 39/43]
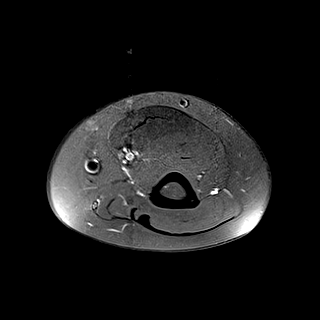
[im 43/43]
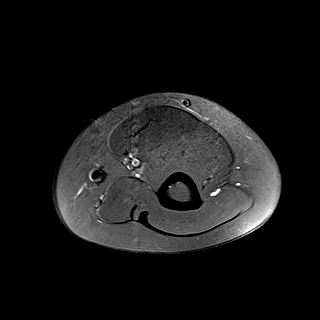

[Series 5: T2 fat-sat · coronal · left · 3.0mm · 0.44mm/px · 8 of 27 slices shown (2 of 2)]
[im 1/27]
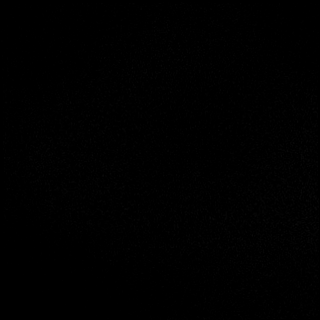
[im 4/27]
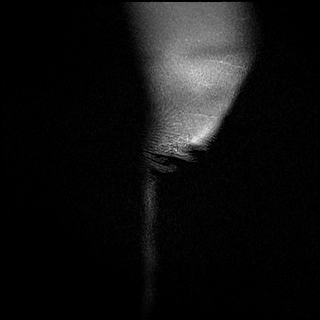
[im 8/27]
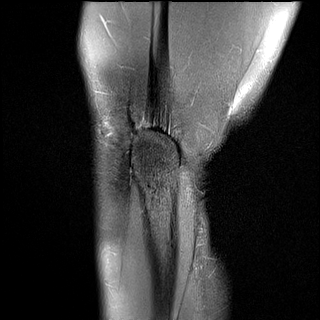
[im 12/27]
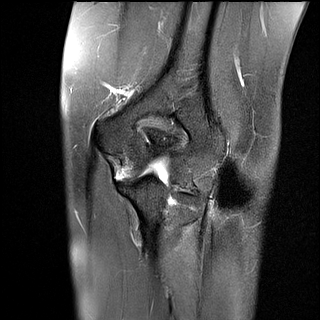
[im 15/27]
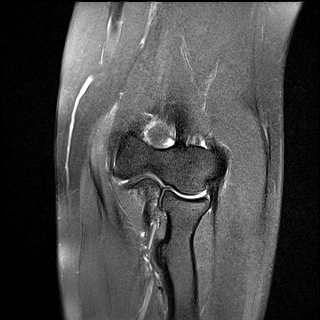
[im 19/27]
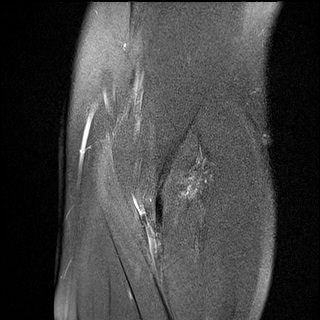
[im 23/27]
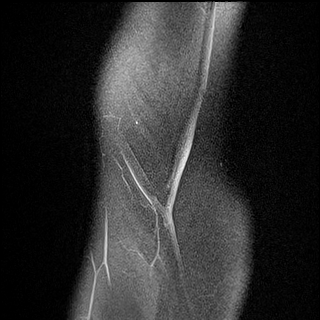
[im 27/27]
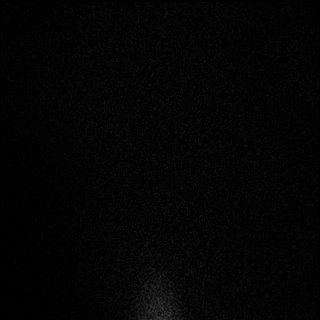

[Series 6: PD fat-sat · sagittal · left · 3.0mm · 0.36mm/px · 8 of 30 slices shown]
[im 1/30]
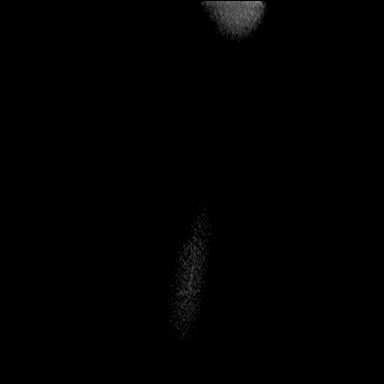
[im 5/30]
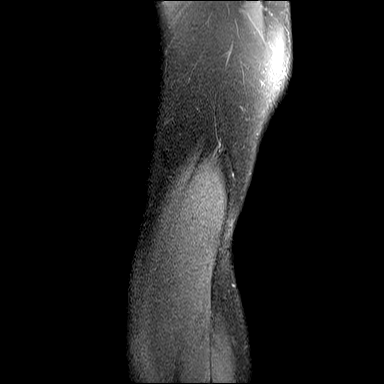
[im 9/30]
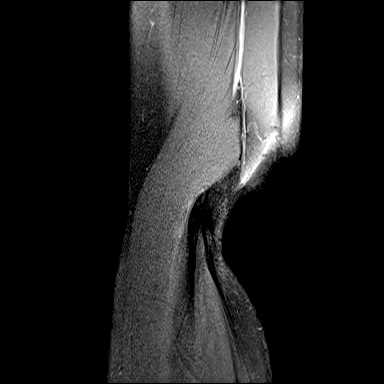
[im 13/30]
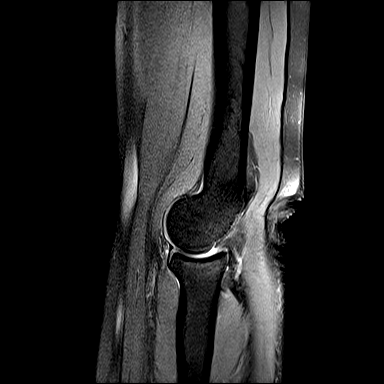
[im 17/30]
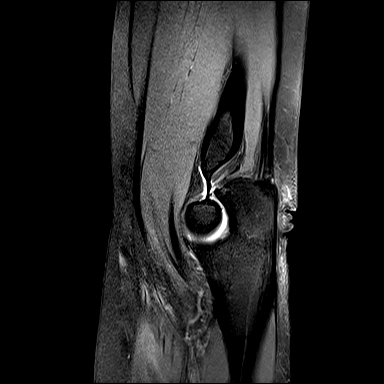
[im 21/30]
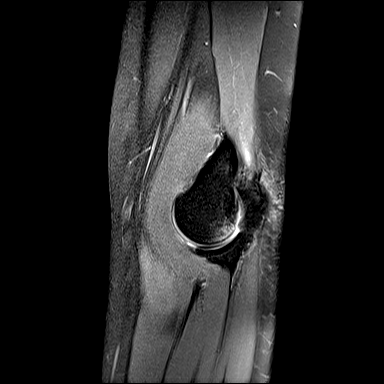
[im 25/30]
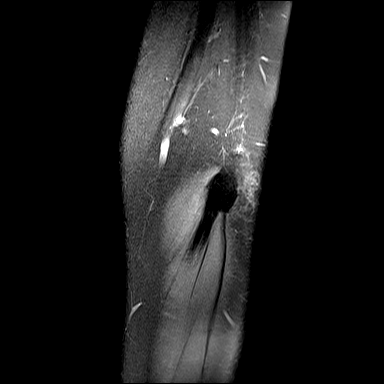
[im 30/30]
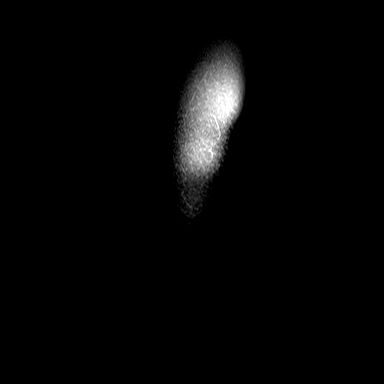

[40 of 40 positions shown; findings below may reference images not displayed]

FINDINGS: Soft tissues: There is marked subcutaneous fat atrophy overlying the
anconeus and extensor carpi ulnaris muscles along the posterolateral
aspect of the elbow joint. No muscle atrophy.

TENDONS

Common forearm flexor origin: Intact

Common forearm extensor origin: Intact

Biceps: Intact

Triceps: Intact

LIGAMENTS

Medial stabilizers: Intact

Lateral stabilizers:  Intact

Cartilage:  No chondral defect

Joint:   No joint effusion

Cubital tunnel: Unremarkable

Bones: No fracture, marrow edema or bone destruction.
IMPRESSION: Marked subcutaneous lipoatrophy along the posterolateral aspect of
the elbow overlying the anconeus and extensor carpi ulnaris muscles.
This phenomenon has been described in the setting of previous
steroid injection into the soft tissues and reportedly can last for
6-12 months and is noted to be reversible within a year.

No underlying osseous abnormality.  No joint effusion.

## 2018-02-16 DIAGNOSIS — S8992XA Unspecified injury of left lower leg, initial encounter: Secondary | ICD-10-CM | POA: Diagnosis not present

## 2018-04-02 DIAGNOSIS — Z3202 Encounter for pregnancy test, result negative: Secondary | ICD-10-CM | POA: Diagnosis not present

## 2018-04-02 DIAGNOSIS — Z30430 Encounter for insertion of intrauterine contraceptive device: Secondary | ICD-10-CM | POA: Diagnosis not present

## 2018-04-25 DIAGNOSIS — F4321 Adjustment disorder with depressed mood: Secondary | ICD-10-CM | POA: Diagnosis not present

## 2018-05-21 DIAGNOSIS — Z30431 Encounter for routine checking of intrauterine contraceptive device: Secondary | ICD-10-CM | POA: Diagnosis not present

## 2018-06-27 DIAGNOSIS — Z0000 Encounter for general adult medical examination without abnormal findings: Secondary | ICD-10-CM | POA: Diagnosis not present

## 2018-06-27 DIAGNOSIS — T452X5A Adverse effect of vitamins, initial encounter: Secondary | ICD-10-CM | POA: Diagnosis not present

## 2018-06-27 DIAGNOSIS — Z7251 High risk heterosexual behavior: Secondary | ICD-10-CM | POA: Diagnosis not present

## 2018-06-27 DIAGNOSIS — L989 Disorder of the skin and subcutaneous tissue, unspecified: Secondary | ICD-10-CM | POA: Diagnosis not present

## 2018-06-27 DIAGNOSIS — L409 Psoriasis, unspecified: Secondary | ICD-10-CM | POA: Diagnosis not present

## 2018-06-27 DIAGNOSIS — H409 Unspecified glaucoma: Secondary | ICD-10-CM | POA: Diagnosis not present

## 2018-08-14 DIAGNOSIS — D485 Neoplasm of uncertain behavior of skin: Secondary | ICD-10-CM | POA: Diagnosis not present

## 2018-08-14 DIAGNOSIS — D2372 Other benign neoplasm of skin of left lower limb, including hip: Secondary | ICD-10-CM | POA: Diagnosis not present

## 2018-08-14 DIAGNOSIS — D2272 Melanocytic nevi of left lower limb, including hip: Secondary | ICD-10-CM | POA: Diagnosis not present

## 2018-08-14 DIAGNOSIS — L400 Psoriasis vulgaris: Secondary | ICD-10-CM | POA: Diagnosis not present

## 2018-08-14 DIAGNOSIS — D2271 Melanocytic nevi of right lower limb, including hip: Secondary | ICD-10-CM | POA: Diagnosis not present

## 2018-08-14 DIAGNOSIS — D225 Melanocytic nevi of trunk: Secondary | ICD-10-CM | POA: Diagnosis not present

## 2018-09-11 DIAGNOSIS — Z6830 Body mass index (BMI) 30.0-30.9, adult: Secondary | ICD-10-CM | POA: Diagnosis not present

## 2018-09-11 DIAGNOSIS — Z01419 Encounter for gynecological examination (general) (routine) without abnormal findings: Secondary | ICD-10-CM | POA: Diagnosis not present

## 2018-11-09 DIAGNOSIS — F4321 Adjustment disorder with depressed mood: Secondary | ICD-10-CM | POA: Diagnosis not present

## 2018-11-14 DIAGNOSIS — Z0000 Encounter for general adult medical examination without abnormal findings: Secondary | ICD-10-CM | POA: Diagnosis not present

## 2018-11-21 DIAGNOSIS — Z1331 Encounter for screening for depression: Secondary | ICD-10-CM | POA: Diagnosis not present

## 2018-11-21 DIAGNOSIS — Z0000 Encounter for general adult medical examination without abnormal findings: Secondary | ICD-10-CM | POA: Diagnosis not present

## 2018-11-30 DIAGNOSIS — N760 Acute vaginitis: Secondary | ICD-10-CM | POA: Diagnosis not present

## 2018-11-30 DIAGNOSIS — A493 Mycoplasma infection, unspecified site: Secondary | ICD-10-CM | POA: Diagnosis not present

## 2018-11-30 DIAGNOSIS — Z113 Encounter for screening for infections with a predominantly sexual mode of transmission: Secondary | ICD-10-CM | POA: Diagnosis not present

## 2018-11-30 DIAGNOSIS — N941 Unspecified dyspareunia: Secondary | ICD-10-CM | POA: Diagnosis not present

## 2018-11-30 DIAGNOSIS — Z3202 Encounter for pregnancy test, result negative: Secondary | ICD-10-CM | POA: Diagnosis not present

## 2019-01-03 DIAGNOSIS — R895 Abnormal microbiological findings in specimens from other organs, systems and tissues: Secondary | ICD-10-CM | POA: Diagnosis not present

## 2019-01-03 DIAGNOSIS — N760 Acute vaginitis: Secondary | ICD-10-CM | POA: Diagnosis not present

## 2019-01-18 DIAGNOSIS — H524 Presbyopia: Secondary | ICD-10-CM | POA: Diagnosis not present

## 2019-01-18 DIAGNOSIS — H5203 Hypermetropia, bilateral: Secondary | ICD-10-CM | POA: Diagnosis not present

## 2019-01-18 DIAGNOSIS — H40033 Anatomical narrow angle, bilateral: Secondary | ICD-10-CM | POA: Diagnosis not present

## 2019-02-14 DIAGNOSIS — F4321 Adjustment disorder with depressed mood: Secondary | ICD-10-CM | POA: Diagnosis not present

## 2019-02-18 DIAGNOSIS — F4321 Adjustment disorder with depressed mood: Secondary | ICD-10-CM | POA: Diagnosis not present

## 2019-02-22 DIAGNOSIS — F419 Anxiety disorder, unspecified: Secondary | ICD-10-CM | POA: Diagnosis not present

## 2019-03-28 DIAGNOSIS — F4323 Adjustment disorder with mixed anxiety and depressed mood: Secondary | ICD-10-CM | POA: Diagnosis not present

## 2019-04-23 DIAGNOSIS — F4321 Adjustment disorder with depressed mood: Secondary | ICD-10-CM | POA: Diagnosis not present

## 2019-08-15 DIAGNOSIS — D2372 Other benign neoplasm of skin of left lower limb, including hip: Secondary | ICD-10-CM | POA: Diagnosis not present

## 2019-08-15 DIAGNOSIS — D225 Melanocytic nevi of trunk: Secondary | ICD-10-CM | POA: Diagnosis not present

## 2019-08-15 DIAGNOSIS — D2271 Melanocytic nevi of right lower limb, including hip: Secondary | ICD-10-CM | POA: Diagnosis not present

## 2019-08-15 DIAGNOSIS — D2261 Melanocytic nevi of right upper limb, including shoulder: Secondary | ICD-10-CM | POA: Diagnosis not present

## 2019-08-20 DIAGNOSIS — F4321 Adjustment disorder with depressed mood: Secondary | ICD-10-CM | POA: Diagnosis not present

## 2019-09-16 DIAGNOSIS — Z6829 Body mass index (BMI) 29.0-29.9, adult: Secondary | ICD-10-CM | POA: Diagnosis not present

## 2019-09-16 DIAGNOSIS — Z113 Encounter for screening for infections with a predominantly sexual mode of transmission: Secondary | ICD-10-CM | POA: Diagnosis not present

## 2019-09-16 DIAGNOSIS — Z01419 Encounter for gynecological examination (general) (routine) without abnormal findings: Secondary | ICD-10-CM | POA: Diagnosis not present

## 2019-10-08 DIAGNOSIS — J343 Hypertrophy of nasal turbinates: Secondary | ICD-10-CM | POA: Diagnosis not present

## 2019-12-18 DIAGNOSIS — S93402A Sprain of unspecified ligament of left ankle, initial encounter: Secondary | ICD-10-CM | POA: Diagnosis not present

## 2019-12-27 DIAGNOSIS — J343 Hypertrophy of nasal turbinates: Secondary | ICD-10-CM | POA: Diagnosis not present

## 2020-01-08 DIAGNOSIS — Z30432 Encounter for removal of intrauterine contraceptive device: Secondary | ICD-10-CM | POA: Diagnosis not present

## 2020-01-29 DIAGNOSIS — H5203 Hypermetropia, bilateral: Secondary | ICD-10-CM | POA: Diagnosis not present

## 2020-01-29 DIAGNOSIS — H40033 Anatomical narrow angle, bilateral: Secondary | ICD-10-CM | POA: Diagnosis not present

## 2020-02-17 DIAGNOSIS — Z20822 Contact with and (suspected) exposure to covid-19: Secondary | ICD-10-CM | POA: Diagnosis not present

## 2020-03-19 DIAGNOSIS — Z20822 Contact with and (suspected) exposure to covid-19: Secondary | ICD-10-CM | POA: Diagnosis not present

## 2020-07-23 DIAGNOSIS — F4325 Adjustment disorder with mixed disturbance of emotions and conduct: Secondary | ICD-10-CM | POA: Diagnosis not present

## 2020-08-06 DIAGNOSIS — F4325 Adjustment disorder with mixed disturbance of emotions and conduct: Secondary | ICD-10-CM | POA: Diagnosis not present

## 2020-08-19 DIAGNOSIS — D2262 Melanocytic nevi of left upper limb, including shoulder: Secondary | ICD-10-CM | POA: Diagnosis not present

## 2020-08-19 DIAGNOSIS — D2261 Melanocytic nevi of right upper limb, including shoulder: Secondary | ICD-10-CM | POA: Diagnosis not present

## 2020-08-19 DIAGNOSIS — L720 Epidermal cyst: Secondary | ICD-10-CM | POA: Diagnosis not present

## 2020-08-19 DIAGNOSIS — D225 Melanocytic nevi of trunk: Secondary | ICD-10-CM | POA: Diagnosis not present

## 2020-10-14 DIAGNOSIS — Z6830 Body mass index (BMI) 30.0-30.9, adult: Secondary | ICD-10-CM | POA: Diagnosis not present

## 2020-10-14 DIAGNOSIS — Z01419 Encounter for gynecological examination (general) (routine) without abnormal findings: Secondary | ICD-10-CM | POA: Diagnosis not present

## 2020-10-14 DIAGNOSIS — Z1231 Encounter for screening mammogram for malignant neoplasm of breast: Secondary | ICD-10-CM | POA: Diagnosis not present

## 2021-02-02 DIAGNOSIS — H40033 Anatomical narrow angle, bilateral: Secondary | ICD-10-CM | POA: Diagnosis not present

## 2021-02-02 DIAGNOSIS — H5203 Hypermetropia, bilateral: Secondary | ICD-10-CM | POA: Diagnosis not present

## 2021-02-02 DIAGNOSIS — H04123 Dry eye syndrome of bilateral lacrimal glands: Secondary | ICD-10-CM | POA: Diagnosis not present

## 2021-02-02 DIAGNOSIS — H524 Presbyopia: Secondary | ICD-10-CM | POA: Diagnosis not present

## 2021-02-17 DIAGNOSIS — J038 Acute tonsillitis due to other specified organisms: Secondary | ICD-10-CM | POA: Diagnosis not present

## 2021-02-17 DIAGNOSIS — J029 Acute pharyngitis, unspecified: Secondary | ICD-10-CM | POA: Diagnosis not present

## 2021-04-06 DIAGNOSIS — F4322 Adjustment disorder with anxiety: Secondary | ICD-10-CM | POA: Diagnosis not present

## 2021-05-24 DIAGNOSIS — F4322 Adjustment disorder with anxiety: Secondary | ICD-10-CM | POA: Diagnosis not present

## 2021-07-05 DIAGNOSIS — F4322 Adjustment disorder with anxiety: Secondary | ICD-10-CM | POA: Diagnosis not present

## 2021-07-27 DIAGNOSIS — F4322 Adjustment disorder with anxiety: Secondary | ICD-10-CM | POA: Diagnosis not present

## 2021-08-23 DIAGNOSIS — F4322 Adjustment disorder with anxiety: Secondary | ICD-10-CM | POA: Diagnosis not present

## 2021-08-25 DIAGNOSIS — D2371 Other benign neoplasm of skin of right lower limb, including hip: Secondary | ICD-10-CM | POA: Diagnosis not present

## 2021-08-25 DIAGNOSIS — D2261 Melanocytic nevi of right upper limb, including shoulder: Secondary | ICD-10-CM | POA: Diagnosis not present

## 2021-08-25 DIAGNOSIS — D2239 Melanocytic nevi of other parts of face: Secondary | ICD-10-CM | POA: Diagnosis not present

## 2021-08-25 DIAGNOSIS — D225 Melanocytic nevi of trunk: Secondary | ICD-10-CM | POA: Diagnosis not present

## 2021-08-25 DIAGNOSIS — D485 Neoplasm of uncertain behavior of skin: Secondary | ICD-10-CM | POA: Diagnosis not present

## 2021-08-25 DIAGNOSIS — L400 Psoriasis vulgaris: Secondary | ICD-10-CM | POA: Diagnosis not present

## 2021-09-21 DIAGNOSIS — F4322 Adjustment disorder with anxiety: Secondary | ICD-10-CM | POA: Diagnosis not present

## 2021-10-28 DIAGNOSIS — F4322 Adjustment disorder with anxiety: Secondary | ICD-10-CM | POA: Diagnosis not present

## 2021-11-10 DIAGNOSIS — Z01419 Encounter for gynecological examination (general) (routine) without abnormal findings: Secondary | ICD-10-CM | POA: Diagnosis not present

## 2021-11-10 DIAGNOSIS — N959 Unspecified menopausal and perimenopausal disorder: Secondary | ICD-10-CM | POA: Diagnosis not present

## 2021-11-10 DIAGNOSIS — Z6830 Body mass index (BMI) 30.0-30.9, adult: Secondary | ICD-10-CM | POA: Diagnosis not present

## 2021-11-10 DIAGNOSIS — Z1231 Encounter for screening mammogram for malignant neoplasm of breast: Secondary | ICD-10-CM | POA: Diagnosis not present

## 2021-11-29 DIAGNOSIS — F4322 Adjustment disorder with anxiety: Secondary | ICD-10-CM | POA: Diagnosis not present

## 2022-01-31 DIAGNOSIS — F4322 Adjustment disorder with anxiety: Secondary | ICD-10-CM | POA: Diagnosis not present

## 2022-02-07 DIAGNOSIS — H524 Presbyopia: Secondary | ICD-10-CM | POA: Diagnosis not present

## 2022-02-07 DIAGNOSIS — H40033 Anatomical narrow angle, bilateral: Secondary | ICD-10-CM | POA: Diagnosis not present

## 2022-02-07 DIAGNOSIS — H5203 Hypermetropia, bilateral: Secondary | ICD-10-CM | POA: Diagnosis not present

## 2022-03-30 DIAGNOSIS — J0100 Acute maxillary sinusitis, unspecified: Secondary | ICD-10-CM | POA: Diagnosis not present

## 2022-03-30 DIAGNOSIS — J029 Acute pharyngitis, unspecified: Secondary | ICD-10-CM | POA: Diagnosis not present

## 2022-05-02 DIAGNOSIS — F4322 Adjustment disorder with anxiety: Secondary | ICD-10-CM | POA: Diagnosis not present

## 2022-05-26 DIAGNOSIS — N912 Amenorrhea, unspecified: Secondary | ICD-10-CM | POA: Diagnosis not present

## 2022-06-01 DIAGNOSIS — F4322 Adjustment disorder with anxiety: Secondary | ICD-10-CM | POA: Diagnosis not present

## 2022-06-27 DIAGNOSIS — N912 Amenorrhea, unspecified: Secondary | ICD-10-CM | POA: Diagnosis not present

## 2022-06-27 DIAGNOSIS — N959 Unspecified menopausal and perimenopausal disorder: Secondary | ICD-10-CM | POA: Diagnosis not present

## 2022-07-14 DIAGNOSIS — N912 Amenorrhea, unspecified: Secondary | ICD-10-CM | POA: Diagnosis not present

## 2022-08-12 DIAGNOSIS — F4322 Adjustment disorder with anxiety: Secondary | ICD-10-CM | POA: Diagnosis not present

## 2022-09-19 ENCOUNTER — Ambulatory Visit (INDEPENDENT_AMBULATORY_CARE_PROVIDER_SITE_OTHER): Payer: BLUE CROSS/BLUE SHIELD | Admitting: Physician Assistant

## 2022-09-19 ENCOUNTER — Encounter: Payer: Self-pay | Admitting: Physician Assistant

## 2022-09-19 VITALS — BP 112/73 | HR 73 | Ht 66.5 in | Wt 194.0 lb

## 2022-09-19 DIAGNOSIS — F411 Generalized anxiety disorder: Secondary | ICD-10-CM | POA: Diagnosis not present

## 2022-09-19 DIAGNOSIS — Z87898 Personal history of other specified conditions: Secondary | ICD-10-CM

## 2022-09-19 DIAGNOSIS — G4700 Insomnia, unspecified: Secondary | ICD-10-CM

## 2022-09-19 MED ORDER — CHLORDIAZEPOXIDE HCL 25 MG PO CAPS: ORAL_CAPSULE | ORAL | 0 refills | Status: DC

## 2022-09-19 MED ORDER — ACAMPROSATE CALCIUM 333 MG PO TBEC: 666.0000 mg | DELAYED_RELEASE_TABLET | Freq: Three times a day (TID) | ORAL | 1 refills | Status: DC

## 2022-09-19 MED ORDER — THIAMINE HCL 100 MG PO TABS: 100.0000 mg | ORAL_TABLET | Freq: Every day | ORAL | 11 refills | Status: AC

## 2022-09-19 NOTE — Patient Instructions (Addendum)
On the Librium 25 mg, Take 2 p.o. 4 times daily for 2 days, then 2 p.o. 3 times daily for 2 days, then 1 p.o. 4 times daily for 2 days, then 1 p.o. 3 times daily for 2 days, then 1 p.o. twice daily for 2 days and then stop.  Start Thiamine 100 mg daily.  Start Accamprostate prescription

## 2022-09-19 NOTE — Progress Notes (Signed)
[facility] MD/PA/NP Initial Note  09/19/2022 5:46 PM Diana Leach  MRN:  [id number]  Chief Complaint:  Chief Complaint   Establish Care    HPI:  Presents at the suggestion of her counselor, Carolin Coy, [facility].  Several problems are going on.  Not sleeping well, drinks to help her relax and as a sleep aid.  She will drink 1 alcoholic beverage while she is cooking dinner each evening, and then she may have 2-3 more before going to bed.  She has a very stressful job in [place] and needs to relax after she gets home.  She has a hard time falling asleep if she does not drink.  She never drinks in the morning or during the day.  It is not affecting her negatively at work.  It does not affect her relationships with her family or friends.  She is very open with her close family and friends about this problem.  States she has a history of smoking pot since she was 42 years old and did cocaine for a few years up until 2003 she has been clean of cocaine and smoking pot.  But she started drinking instead.  For the past few years she has felt guilty for drinking.  She sometimes uses melatonin instead of alcohol but still does not sleep as well.  She stopped drinking for 30 days last year, she quit on her own without any detox.  She started right back after she made it to 30 days as she had told her counselor that she would.  At that time she was drinking as much, or even more, as she is now.  States she does not feel depressed.  However she recently found out that she is in menopause and that has been depressing to find that out.  She is not married and does not have kids.  She was exercising almost daily until she found out this news and has decreased her exercise routine.  She does plan to get back into it soon though.  Patient is able to enjoy things.  Energy and motivation are good.  She is a very social person, talking with or texting family members or friends on a daily basis.  She is active in her church  and at work.  No extreme sadness, tearfulness, or feelings of hopelessness.  ADLs and personal hygiene are normal.   Denies any changes in concentration, making decisions, or remembering things.  Appetite has not changed.  Weight is stable.  Denies suicidal or homicidal thoughts.  No panic attacks.  She does feel anxious, not being able to shut her mind off in the evenings after she gets home from work until she has a drink.  Patient denies increased energy with decreased need for sleep, increased talkativeness, racing thoughts, impulsivity or risky behaviors, increased spending, increased libido, grandiosity, increased irritability or anger, paranoia, or hallucinations.  Visit Diagnosis:    ICD-10-CM   1. Generalized anxiety disorder  F41.1     2. Insomnia, unspecified type  G47.00     3. History of alcohol use disorder  Z87.898      Past Psychiatric History:   Past medications for mental health diagnoses include: Wellbutrin for smoking cessation made her feel horrible, in 2020 her BF cheated on her and dog died and PCP Rx Klonopin for about a month, Melatonin  No psych hospitalizations. No suicide attempts.  Past Medical History:  Past Medical History:  Diagnosis Date   Early menopause  Glaucoma    Seasonal allergies     Past Surgical History:  Procedure Laterality Date   GLAUCOMA REPAIR Bilateral     Family Psychiatric History:  See below  Family History:  Family History  Problem Relation Age of Onset   Healthy Mother    Diabetes Father    Hypertension Father    Tongue cancer Father    Heart disease Father    Sleep apnea Father    Obesity Father    Anxiety disorder Sister    Obesity Sister    Sleep apnea Sister    Diabetes Sister    Alcohol abuse Maternal Grandfather    Alcohol abuse Maternal Grandmother    Throat cancer Maternal Grandmother    Heart disease Paternal Grandfather     Social History:  Social History   Socioeconomic History   Marital  status: Single    Spouse name: Not on file   Number of children: Not on file   Years of education: Not on file   Highest education level: Bachelor's degree (e.g., BA, AB, BS)  Occupational History   Not on file  Tobacco Use   Smoking status: Former    Types: Cigarettes   Smokeless tobacco: Never   Tobacco comments:    Quit smoking 2011  Substance and Sexual Activity   Alcohol use: Not Currently    Comment: see Progress note   Drug use: Not Currently    Comment: see Inititial Psych note 09/19/22   Sexual activity: Not on file  Other Topics Concern   Not on file  Social History Narrative   Never married. Grew up in [place], [place]. Has an older sister. Dad is a Land. Mom is a pastor's wife.    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       Caffeine-3 coffee   Legal-never   Religion-Christian, regular church attendance      Social Determinants of Health   Financial Resource Strain: Low Risk  (09/19/2022)   Overall Financial Resource Strain (CARDIA)    Difficulty of Paying Living Expenses: Not hard at all  Food Insecurity: No Food Insecurity (09/19/2022)   Hunger Vital Sign    Worried About Running Out of Food in the Last Year: Never true    Ran Out of Food in the Last Year: Never true  Transportation Needs: No Transportation Needs (09/19/2022)   PRAPARE - Hydrologist (Medical): No    Lack of Transportation (Non-Medical): No  Physical Activity: Sufficiently Active (09/19/2022)   Exercise Vital Sign    Days of Exercise per Week: 3 days    Minutes of Exercise per Session: 60 min  Stress: Stress Concern Present (09/19/2022)   Assumption    Feeling of Stress : Very much  Social Connections: Moderately Integrated (09/19/2022)   Social Connection and Isolation Panel [NHANES]    Frequency of Communication with Friends and Family: More than three times a week     Frequency of Social Gatherings with Friends and Family: More than three times a week    Attends Religious Services: More than 4 times per year    Active Member of [employer] or Organizations: Yes    Attends Archivist Meetings: More than 4 times per year    Marital Status: Never married    Allergies:  Allergies  Allergen Reactions   Erythromycin Hives and Rash    Other Reaction:  FULL BODY RASH    Metabolic Disorder Labs: No results found for: "HGBA1C", "MPG" No results found for: "PROLACTIN" No results found for: "CHOL", "TRIG", "HDL", "CHOLHDL", "VLDL", "LDLCALC" No results found for: "TSH"  Therapeutic Level Labs: No results found for: "LITHIUM" No results found for: "VALPROATE" No results found for: "CBMZ"  Current Medications: Current Outpatient Medications  Medication Sig Dispense Refill   acamprosate (CAMPRAL) 333 MG tablet Take 2 tablets (666 mg total) by mouth 3 (three) times daily with meals. 180 tablet 1   ascorbic acid (VITAMIN C) 500 MG tablet Take 500 mg by mouth daily.     chlordiazePOXIDE (LIBRIUM) 25 MG capsule 2 p.o. 4 times daily for 2 days, then 2 p.o. 3 times daily for 2 days, then 1 p.o. 4 times daily for 2 days, then 1 p.o. 3 times daily for 2 days, then 1 p.o. twice daily for 2 days and then stop 42 capsule 0   co-enzyme Q-10 30 MG capsule Take 30 mg by mouth 3 (three) times daily.     cyanocobalamin (VITAMIN B12) 500 MCG tablet Take 500 mcg by mouth daily.     magnesium 30 MG tablet Take 30 mg by mouth 2 (two) times daily.     Melatonin 3-10 MG TABS Take by mouth.     Multiple Vitamin (MULTIVITAMIN) capsule Take 1 capsule by mouth daily.     Omega-3 Fatty Acids (OMEGA 3 PO) Take by mouth.     Oyster Shell (OYSTER CALCIUM) 500 MG TABS tablet Take 500 mg of elemental calcium by mouth 2 (two) times daily.     thiamine (VITAMIN B1) 100 MG tablet Take 1 tablet (100 mg total) by mouth daily. 30 tablet 11   bacitracin ointment Apply 1 application topically  2 (two) times daily. (Patient not taking: Reported on 05/09/2016) 120 g 0   cyclobenzaprine (FLEXERIL) 5 MG tablet Take 5 mg by mouth 3 (three) times daily as needed for muscle spasms. (Patient not taking: Reported on 09/19/2022)     naproxen (NAPROSYN) 500 MG tablet Take 500 mg by mouth daily as needed for moderate pain.  (Patient not taking: Reported on 09/19/2022)     oxyCODONE-acetaminophen (PERCOCET/ROXICET) 5-325 MG tablet Take 1 tablet by mouth every 6 (six) hours as needed for severe pain. (Patient not taking: Reported on 05/09/2016) 12 tablet 0   No current facility-administered medications for this visit.    Medication Side Effects: none  Orders placed this visit:  No orders of the defined types were placed in this encounter.   Psychiatric Specialty Exam:  Review of Systems  Constitutional: Negative.   HENT: Negative.    Eyes: Negative.   Respiratory: Negative.    Cardiovascular: Negative.   Gastrointestinal: Negative.   Endocrine: Negative.   Genitourinary: Negative.   Musculoskeletal: Negative.   Skin: Negative.   Allergic/Immunologic: Negative.   Neurological: Negative.   Hematological: Negative.   Psychiatric/Behavioral:         See HPI    Blood pressure 112/73, pulse 73, height 5' 6.5" (1.689 m), weight 194 lb (88 kg).Body mass index is 30.84 kg/m.  General Appearance: Casual and Well Groomed  Eye Contact:  Good  Speech:  Clear and Coherent and Normal Rate  Volume:  Normal  Mood:   sad  Affect:  Tearful  Thought Process:  Goal Directed and Descriptions of Associations: Circumstantial  Orientation:  Full (Time, Place, and Person)  Thought Content: Logical   Suicidal Thoughts:  No  Homicidal Thoughts:  No  Memory:  WNL  Judgement:  Good  Insight:  Good  Psychomotor Activity:  Normal  Concentration:  Concentration: Good  Recall:  Good  Fund of Knowledge: Good  Language: Good  Assets:  Communication Skills Desire for Improvement Financial  Resources/Insurance Housing Transportation Vocational/Educational  ADL's:  Intact  Cognition: WNL  Prognosis:  Good   Screenings:  AUDIT    Flowsheet Row Office Visit from 09/19/2022 in [facility]  Alcohol Use Disorder Identification Test Final Score (AUDIT) 6      [place] Office Visit from 09/19/2022 in [facility]  CAGE-AID Score 2      [place] Office Visit from 09/19/2022 in [facility]  Total GAD-7 Score 0      PHQ2-9    [place] Office Visit from 09/19/2022 in [facility]  PHQ-2 Total Score 0      Receiving Psychotherapy: Yes   with Carolin Coy [facility]  Treatment Plan/Recommendations:  PDMP reviewed.  No controlled substances listed. I provided 60 minutes of face to face time during this encounter, including time spent before and after the visit in records review, medical decision making, counseling pertinent to today's visit, and charting.   Long discussion about alcohol use.  Discussed inpatient detox, outpatient with Librium and a acamprosate with benefits, risk and side effects discussed of that treatment regimen and AA meetings or celebrate recovery with continued individual therapy with Carolin Coy [facility].  We agree on outpatient treatment with Librium and acamprosate.  She will start that this coming weekend. Healthy lifestyle choices, including getting back with regular exercise.  Start acamprosate 333 mg, 2 p.o. 3 times daily. Start Librium 25 mg, 2 p.o. 4 times daily for 2 days then 2 p.o. 3 times daily for 2 days, then 1 p.o. 4 times daily for 2 days, then 1 p.o. 3 times daily for 2 days then 1 p.o. twice daily for 2 days and then stop. Start thiamine 100 mg 1 p.o. daily. Continue therapy with Carolin Coy [facility]. Return in 2 to 3 weeks.  Donnal Moat, PA-C

## 2022-09-30 ENCOUNTER — Telehealth: Payer: Self-pay | Admitting: Physician Assistant

## 2022-09-30 ENCOUNTER — Other Ambulatory Visit: Payer: Self-pay

## 2022-09-30 MED ORDER — CHLORDIAZEPOXIDE HCL 25 MG PO CAPS: ORAL_CAPSULE | ORAL | 0 refills | Status: DC

## 2022-09-30 NOTE — Telephone Encounter (Signed)
Pt LVM @ 9:30a.  She said she the script for  chlordiazePOXIDE (LIBRIUM) 25 MG capsule  Should have been for 46 pills, but she only got 42.  Pharmacy told her she would have to call use back to get the additional 4 pill sent.  Next appt 4/23

## 2022-09-30 NOTE — Telephone Encounter (Signed)
OK, please call pharmacy and give them the ok to fill it. Same directions. Thanks

## 2022-09-30 NOTE — Telephone Encounter (Signed)
Please see message.  I did the math also and it should be 46 tabs.

## 2022-09-30 NOTE — Telephone Encounter (Signed)
Pharmacy said since it was controlled they needed a new Rx. Pended the same as previous, except #46.

## 2022-10-11 ENCOUNTER — Encounter: Payer: Self-pay | Admitting: Physician Assistant

## 2022-10-11 ENCOUNTER — Ambulatory Visit (INDEPENDENT_AMBULATORY_CARE_PROVIDER_SITE_OTHER): Payer: BC Managed Care – PPO | Admitting: Physician Assistant

## 2022-10-11 DIAGNOSIS — G4700 Insomnia, unspecified: Secondary | ICD-10-CM

## 2022-10-11 DIAGNOSIS — F411 Generalized anxiety disorder: Secondary | ICD-10-CM | POA: Diagnosis not present

## 2022-10-11 DIAGNOSIS — F1021 Alcohol dependence, in remission: Secondary | ICD-10-CM

## 2022-10-11 MED ORDER — TRAZODONE HCL 100 MG PO TABS: 50.0000 mg | ORAL_TABLET | Freq: Every evening | ORAL | 1 refills | Status: DC | PRN

## 2022-10-11 NOTE — Progress Notes (Signed)
[facility]  Patient ID: Diana Leach,  MRN: [id number]  PCP: Cleatis Polka., MD  Date of Evaluation: 10/11/2022 Time spent:20 minutes  Chief Complaint:  Chief Complaint   Follow-up    HISTORY/CURRENT STATUS: HPI  For 3 week med check  Librium detox was prescribed 3 weeks ago.  She took as directed even though another prescription had been sent in by mistake.  She has put those pills in the back of her medicine cabinet and will not take without discussing with me first.  She tearfully and joyfully says that she has not had any alcohol since a few days after our visit as was directed.  States she has not been sober since she was 42 years old and it is wonderful to not be using substances.  The acamprosate is helping with cravings.  Patient is able to enjoy things.  Energy and motivation are good.  Work is going well.   No extreme sadness, tearfulness, or feelings of hopelessness.  Sleeps well most of the time. ADLs and personal hygiene are normal.   Denies any changes in concentration, making decisions, or remembering things.  Appetite has not changed.  Weight is stable.  Does get anxious at times but it is not severe, no panic attacks.  Denies suicidal or homicidal thoughts.  Patient denies increased energy with decreased need for sleep, increased talkativeness, racing thoughts, impulsivity or risky behaviors, increased spending, increased libido, grandiosity, increased irritability or anger, paranoia, or hallucinations.  Denies dizziness, syncope, seizures, numbness, tingling, tremor, tics, unsteady gait, slurred speech, confusion. Denies muscle or joint pain, stiffness, or dystonia.  Individual Medical History/ Review of Systems: Changes? :No   Past medications for mental health diagnoses include: Wellbutrin for smoking cessation made her feel horrible, in 2020 her BF cheated on her and dog died and PCP Rx Klonopin for about a month, Melatonin   No psych  hospitalizations. No suicide attempts.  Allergies: Erythromycin  Current Medications:  Current Outpatient Medications:    acamprosate (CAMPRAL) 333 MG tablet, Take 2 tablets (666 mg total) by mouth 3 (three) times daily with meals., Disp: 180 tablet, Rfl: 1   ascorbic acid (VITAMIN C) 500 MG tablet, Take 500 mg by mouth daily., Disp: , Rfl:    co-enzyme Q-10 30 MG capsule, Take 30 mg by mouth 3 (three) times daily., Disp: , Rfl:    cyanocobalamin (VITAMIN B12) 500 MCG tablet, Take 500 mcg by mouth daily., Disp: , Rfl:    magnesium 30 MG tablet, Take 30 mg by mouth 2 (two) times daily., Disp: , Rfl:    Melatonin 3-10 MG TABS, Take by mouth., Disp: , Rfl:    Multiple Vitamin (MULTIVITAMIN) capsule, Take 1 capsule by mouth daily., Disp: , Rfl:    Omega-3 Fatty Acids (OMEGA 3 PO), Take by mouth., Disp: , Rfl:    Oyster Shell (OYSTER CALCIUM) 500 MG TABS tablet, Take 500 mg of elemental calcium by mouth 2 (two) times daily., Disp: , Rfl:    thiamine (VITAMIN B1) 100 MG tablet, Take 1 tablet (100 mg total) by mouth daily., Disp: 30 tablet, Rfl: 11   traZODone (DESYREL) 100 MG tablet, Take 0.5-2 tablets (50-200 mg total) by mouth at bedtime as needed for sleep., Disp: 60 tablet, Rfl: 1 Medication Side Effects: none  Family Medical/ Social History: Changes? No  MENTAL HEALTH EXAM:  There were no vitals taken for this visit.There is no height or weight on file to calculate BMI.  General  Appearance: Casual and Well Groomed  Eye Contact:  Good  Speech:  Clear and Coherent and Normal Rate  Volume:  Normal  Mood:  Euthymic  Affect:  Congruent  Thought Process:  Goal Directed and Descriptions of Associations: Circumstantial  Orientation:  Full (Time, Place, and Person)  Thought Content: Logical   Suicidal Thoughts:  No  Homicidal Thoughts:  No  Memory:  WNL  Judgement:  Good  Insight:  Good  Psychomotor Activity:  Normal  Concentration:  Concentration: Good  Recall:  Good  Fund of  Knowledge: Good  Language: Good  Assets:  Communication Skills Desire for Improvement Financial Resources/Insurance Housing Transportation Vocational/Educational  ADL's:  Intact  Cognition: WNL  Prognosis:  Good   DIAGNOSES:    ICD-10-CM   1. Generalized anxiety disorder  F41.1     2. Insomnia, unspecified type  G47.00     3. Alcohol use disorder, moderate, in early remission [facility])  F10.21      Receiving Psychotherapy: Yes  with Kelton Pillar, [facility]  RECOMMENDATIONS:  PDMP reviewed.  Librium filled 09/20/2022. I provided 20 minutes of face to face time during this encounter, including time spent before and after the visit in records review, medical decision making, counseling pertinent to today's visit, and charting.   Congratulations on almost 3 weeks of sobriety!  We briefly discussed adding naltrexone, it is not necessary at this time but she will call if the cravings worsen even with taking the acamprosate and I will send in the naltrexone.  Continue acamprosate 333 mg, 2 p.o. 3 times daily. Continue trazodone 100 mg, 1/2-2 p.o. nightly as needed sleep. Continue vitamins as per med list.  Stressed importance of thiamine. Continue therapy with Kelton Pillar [facility]. Return in 3 weeks.  Melony Overly, PA-C

## 2022-10-19 DIAGNOSIS — F4322 Adjustment disorder with anxiety: Secondary | ICD-10-CM | POA: Diagnosis not present

## 2022-11-10 ENCOUNTER — Encounter: Payer: Self-pay | Admitting: Physician Assistant

## 2022-11-10 ENCOUNTER — Ambulatory Visit (INDEPENDENT_AMBULATORY_CARE_PROVIDER_SITE_OTHER): Payer: BC Managed Care – PPO | Admitting: Physician Assistant

## 2022-11-10 DIAGNOSIS — F411 Generalized anxiety disorder: Secondary | ICD-10-CM | POA: Diagnosis not present

## 2022-11-10 DIAGNOSIS — F1021 Alcohol dependence, in remission: Secondary | ICD-10-CM

## 2022-11-10 DIAGNOSIS — G4700 Insomnia, unspecified: Secondary | ICD-10-CM

## 2022-11-10 MED ORDER — NALTREXONE HCL 50 MG PO TABS: 50.0000 mg | ORAL_TABLET | Freq: Every day | ORAL | 1 refills | Status: AC

## 2022-11-10 MED ORDER — ACAMPROSATE CALCIUM 333 MG PO TBEC: 666.0000 mg | DELAYED_RELEASE_TABLET | Freq: Three times a day (TID) | ORAL | 5 refills | Status: DC

## 2022-11-10 NOTE — Progress Notes (Signed)
[facility]  Patient ID: Diana Leach,  MRN: [id number]  PCP: Cleatis Polka., MD  Date of Evaluation: 11/10/2022 Time spent:20 minutes  Chief Complaint:  Chief Complaint   Follow-up    HISTORY/CURRENT STATUS: HPI  For routine f/u.  Hasn't had any ETOH in 6 weeks! She tearfully says she hasn't been sober since she was 42 yo and is extremely grateful. The Campral is making a huge difference in cravings. She asks about Naltrexone, which we had discussed before, if it would be appropriate to take on an as needed basis, like if she's going to an event where there will be alcohol served, so she won't be tempted.   Patient is able to enjoy things.  Energy and motivation are good.  Work is going well.   No extreme sadness, tearfulness, or feelings of hopelessness.  Sleeps well.  ADLs and personal hygiene are normal.   Denies any changes in concentration, making decisions, or remembering things.  Appetite has not changed.  Weight is stable. Denies suicidal or homicidal thoughts.  Patient denies increased energy with decreased need for sleep, increased talkativeness, racing thoughts, impulsivity or risky behaviors, increased spending, increased libido, grandiosity, increased irritability or anger, paranoia, or hallucinations.  Denies dizziness, syncope, seizures, numbness, tingling, tremor, tics, unsteady gait, slurred speech, confusion. Denies muscle or joint pain, stiffness, or dystonia.  Individual Medical History/ Review of Systems: Changes? :No   Past medications for mental health diagnoses include: Wellbutrin for smoking cessation made her feel horrible, in 2020 her BF cheated on her and dog died and PCP Rx Klonopin for about a month, Melatonin   No psych hospitalizations. No suicide attempts.  Allergies: Erythromycin  Current Medications:  Current Outpatient Medications:    ascorbic acid (VITAMIN C) 500 MG tablet, Take 500 mg by mouth daily., Disp: , Rfl:     co-enzyme Q-10 30 MG capsule, Take 30 mg by mouth 3 (three) times daily., Disp: , Rfl:    cyanocobalamin (VITAMIN B12) 500 MCG tablet, Take 500 mcg by mouth daily., Disp: , Rfl:    magnesium 30 MG tablet, Take 30 mg by mouth 2 (two) times daily., Disp: , Rfl:    Multiple Vitamin (MULTIVITAMIN) capsule, Take 1 capsule by mouth daily., Disp: , Rfl:    naltrexone (DEPADE) 50 MG tablet, Take 1 tablet (50 mg total) by mouth daily., Disp: 30 tablet, Rfl: 1   Omega-3 Fatty Acids (OMEGA 3 PO), Take by mouth., Disp: , Rfl:    Oyster Shell (OYSTER CALCIUM) 500 MG TABS tablet, Take 500 mg of elemental calcium by mouth 2 (two) times daily., Disp: , Rfl:    thiamine (VITAMIN B1) 100 MG tablet, Take 1 tablet (100 mg total) by mouth daily., Disp: 30 tablet, Rfl: 11   traZODone (DESYREL) 100 MG tablet, Take 0.5-2 tablets (50-200 mg total) by mouth at bedtime as needed for sleep., Disp: 60 tablet, Rfl: 1   acamprosate (CAMPRAL) 333 MG tablet, Take 2 tablets (666 mg total) by mouth 3 (three) times daily with meals., Disp: 180 tablet, Rfl: 5   Melatonin 3-10 MG TABS, Take by mouth. (Patient not taking: Reported on 11/10/2022), Disp: , Rfl:  Medication Side Effects: none  Family Medical/ Social History: Changes? No  MENTAL HEALTH EXAM:  There were no vitals taken for this visit.There is no height or weight on file to calculate BMI.  General Appearance: Casual and Well Groomed  Eye Contact:  Good  Speech:  Clear and Coherent and  Normal Rate  Volume:  Normal  Mood:  Euthymic  Affect:  Congruent  Thought Process:  Goal Directed and Descriptions of Associations: Circumstantial  Orientation:  Full (Time, Place, and Person)  Thought Content: Logical   Suicidal Thoughts:  No  Homicidal Thoughts:  No  Memory:  WNL  Judgement:  Good  Insight:  Good  Psychomotor Activity:  Normal  Concentration:  Concentration: Good  Recall:  Good  Fund of Knowledge: Good  Language: Good  Assets:  Communication Skills Desire  for Improvement Financial Resources/Insurance Housing Resilience Transportation Vocational/Educational  ADL's:  Intact  Cognition: WNL  Prognosis:  Good   DIAGNOSES:    ICD-10-CM   1. Generalized anxiety disorder  F41.1     2. Insomnia, unspecified type  G47.00     3. Alcohol use disorder, moderate, in early remission [facility])  F10.21      Receiving Psychotherapy: Yes  with Kelton Pillar, [facility]  RECOMMENDATIONS:  PDMP reviewed.  Librium filled 09/30/2022. I provided 20 minutes of face to face time during this encounter, including time spent before and after the visit in records review, medical decision making, counseling pertinent to today's visit, and charting.   Congratulations on abstinence! Discussed Naltrexone and although it's not typically Rx for prn use, we can use it off label like that. She would like to try it. Benefits, risks, SE discussed and she accepts.   She'll be seeing PCP soon for labs, I need plt count and LFTs. She'll make sure they're sent to me.   Continue acamprosate 333 mg, 2 p.o. 3 times daily. Start Naltrexone 50 mg, 1 po qd (prn) Continue trazodone 100 mg, 1/2-2 p.o. nightly as needed sleep. Continue vitamins as per med list.  Stressed importance of thiamine. Continue therapy with Kelton Pillar [facility]. Return in 6 weeks.  Melony Overly, PA-C

## 2022-12-05 ENCOUNTER — Telehealth: Payer: Self-pay | Admitting: Physician Assistant

## 2022-12-05 DIAGNOSIS — S93402A Sprain of unspecified ligament of left ankle, initial encounter: Secondary | ICD-10-CM | POA: Diagnosis not present

## 2022-12-05 DIAGNOSIS — M25572 Pain in left ankle and joints of left foot: Secondary | ICD-10-CM | POA: Diagnosis not present

## 2022-12-05 NOTE — Telephone Encounter (Signed)
Pt LVM @ 4:49p.  She said at her visit on 5/23 with Rosey Bath, they discussed the pt getting a physical.  There were some specific things Rosey Bath wanted her to get tested for during the physical and she said she didn't see any info in [place] about it.  Pls call her back.  Next appt 7/9

## 2022-12-05 NOTE — Telephone Encounter (Signed)
Please see message.  Per last visit:  I need plt count and LFTs.  Are there any other labs you would like?

## 2022-12-05 NOTE — Telephone Encounter (Signed)
CBC w/ diff and CMP

## 2022-12-06 NOTE — Telephone Encounter (Signed)
Patient notified of requested labs.

## 2022-12-07 DIAGNOSIS — F4322 Adjustment disorder with anxiety: Secondary | ICD-10-CM | POA: Diagnosis not present

## 2022-12-27 ENCOUNTER — Ambulatory Visit: Payer: BC Managed Care – PPO | Admitting: Physician Assistant

## 2022-12-27 ENCOUNTER — Encounter: Payer: Self-pay | Admitting: Physician Assistant

## 2022-12-27 DIAGNOSIS — F1021 Alcohol dependence, in remission: Secondary | ICD-10-CM | POA: Diagnosis not present

## 2022-12-27 DIAGNOSIS — F411 Generalized anxiety disorder: Secondary | ICD-10-CM

## 2022-12-27 DIAGNOSIS — G4700 Insomnia, unspecified: Secondary | ICD-10-CM

## 2022-12-27 NOTE — Progress Notes (Signed)
[facility]  Patient ID: Diana Leach,  MRN: [id number]  PCP: Cleatis Polka., MD  Date of Evaluation: 12/27/2022 Time spent: 21 minutes  Chief Complaint:  Chief Complaint   Follow-up    HISTORY/CURRENT STATUS: HPI  For routine f/u.  Diana Leach states she is doing great.  It has been 3 months since she has had a drink of alcohol.  "I am so proud of myself.  I have not been sober since I was 42 years old."  The acamprosate is helping her not want any alcohol.  She has not taken the naltrexone, but she will if she will be in a situation where alcohol will be served.  We had talked previously about her taking it as needed.  Patient is able to enjoy things.  Energy and motivation are good.  Work is going well.   No extreme sadness, tearfulness, or feelings of hopelessness.  Sleeps well.  ADLs and personal hygiene are normal.   Denies any changes in concentration, making decisions, or remembering things.  Appetite has not changed.  Weight is stable.  Rarely has anxiety.  Denies suicidal or homicidal thoughts.  Patient denies increased energy with decreased need for sleep, increased talkativeness, racing thoughts, impulsivity or risky behaviors, increased spending, increased libido, grandiosity, increased irritability or anger, paranoia, or hallucinations.  Denies dizziness, syncope, seizures, numbness, tingling, tremor, tics, unsteady gait, slurred speech, confusion. Denies muscle or joint pain, stiffness, or dystonia.  Individual Medical History/ Review of Systems: Changes? :No   Past medications for mental health diagnoses include: Wellbutrin for smoking cessation made her feel horrible, in 2020 her BF cheated on her and dog died and PCP Rx Klonopin for about a month, Melatonin   No psych hospitalizations. No suicide attempts.  Allergies: Erythromycin  Current Medications:  Current Outpatient Medications:    acamprosate (CAMPRAL) 333 MG tablet, Take 2 tablets (666  mg total) by mouth 3 (three) times daily with meals., Disp: 180 tablet, Rfl: 5   ascorbic acid (VITAMIN C) 500 MG tablet, Take 500 mg by mouth daily., Disp: , Rfl:    co-enzyme Q-10 30 MG capsule, Take 30 mg by mouth 3 (three) times daily., Disp: , Rfl:    cyanocobalamin (VITAMIN B12) 500 MCG tablet, Take 500 mcg by mouth daily., Disp: , Rfl:    magnesium 30 MG tablet, Take 30 mg by mouth 2 (two) times daily., Disp: , Rfl:    Multiple Vitamin (MULTIVITAMIN) capsule, Take 1 capsule by mouth daily., Disp: , Rfl:    Omega-3 Fatty Acids (OMEGA 3 PO), Take by mouth., Disp: , Rfl:    Oyster Shell (OYSTER CALCIUM) 500 MG TABS tablet, Take 500 mg of elemental calcium by mouth 2 (two) times daily., Disp: , Rfl:    thiamine (VITAMIN B1) 100 MG tablet, Take 1 tablet (100 mg total) by mouth daily., Disp: 30 tablet, Rfl: 11   traZODone (DESYREL) 100 MG tablet, Take 0.5-2 tablets (50-200 mg total) by mouth at bedtime as needed for sleep. (Patient taking differently: Take 100 mg by mouth at bedtime as needed for sleep.), Disp: 60 tablet, Rfl: 1   Melatonin 3-10 MG TABS, Take by mouth. (Patient not taking: Reported on 11/10/2022), Disp: , Rfl:    naltrexone (DEPADE) 50 MG tablet, Take 1 tablet (50 mg total) by mouth daily. (Patient not taking: Reported on 12/27/2022), Disp: 30 tablet, Rfl: 1 Medication Side Effects: none  Family Medical/ Social History: Changes? No  MENTAL HEALTH EXAM:  There  were no vitals taken for this visit.There is no height or weight on file to calculate BMI.  General Appearance: Casual and Well Groomed  Eye Contact:  Good  Speech:  Clear and Coherent and Normal Rate  Volume:  Normal  Mood:  Euthymic  Affect:  Congruent  Thought Process:  Goal Directed and Descriptions of Associations: Circumstantial  Orientation:  Full (Time, Place, and Person)  Thought Content: Logical   Suicidal Thoughts:  No  Homicidal Thoughts:  No  Memory:  WNL  Judgement:  Good  Insight:  Good  Psychomotor  Activity:  Normal  Concentration:  Concentration: Good  Recall:  Good  Fund of Knowledge: Good  Language: Good  Assets:  Communication Skills Desire for Improvement Financial Resources/Insurance Housing Physical Health Resilience Transportation Vocational/Educational  ADL's:  Intact  Cognition: WNL  Prognosis:  Good   DIAGNOSES:    ICD-10-CM   1. Generalized anxiety disorder  F41.1     2. Insomnia, unspecified type  G47.00     3. Alcohol use disorder, moderate, in early remission [facility])  F10.21       Receiving Psychotherapy: Yes  with Kelton Pillar, [facility]  RECOMMENDATIONS:  PDMP reviewed.  Librium filled 09/30/2022. I provided 21 minutes of face to face time during this encounter, including time spent before and after the visit in records review, medical decision making, counseling pertinent to today's visit, and charting.   I am very happy for her!  She has remained sober for a little over 3 months.  Encouraged her to keep up the good work. No change in treatment needed.  Continue acamprosate 333 mg, 2 p.o. 3 times daily. Take Naltrexone 50 mg, 1 po qd (prn) Continue trazodone 100 mg, 1/2-2 p.o. nightly as needed sleep. Continue vitamins as per med list.  Stressed importance of thiamine. Continue therapy with Kelton Pillar [facility]. Return in 2 months.  Melony Overly, PA-C

## 2023-01-02 ENCOUNTER — Encounter: Payer: Self-pay | Admitting: Physician Assistant

## 2023-01-23 DIAGNOSIS — Z79899 Other long term (current) drug therapy: Secondary | ICD-10-CM | POA: Diagnosis not present

## 2023-02-01 DIAGNOSIS — Z01419 Encounter for gynecological examination (general) (routine) without abnormal findings: Secondary | ICD-10-CM | POA: Diagnosis not present

## 2023-02-01 DIAGNOSIS — Z6832 Body mass index (BMI) 32.0-32.9, adult: Secondary | ICD-10-CM | POA: Diagnosis not present

## 2023-02-01 DIAGNOSIS — Z1231 Encounter for screening mammogram for malignant neoplasm of breast: Secondary | ICD-10-CM | POA: Diagnosis not present

## 2023-02-01 DIAGNOSIS — Z124 Encounter for screening for malignant neoplasm of cervix: Secondary | ICD-10-CM | POA: Diagnosis not present

## 2023-02-01 DIAGNOSIS — Z1151 Encounter for screening for human papillomavirus (HPV): Secondary | ICD-10-CM | POA: Diagnosis not present

## 2023-02-14 DIAGNOSIS — H524 Presbyopia: Secondary | ICD-10-CM | POA: Diagnosis not present

## 2023-02-14 DIAGNOSIS — H40033 Anatomical narrow angle, bilateral: Secondary | ICD-10-CM | POA: Diagnosis not present

## 2023-02-14 DIAGNOSIS — J310 Chronic rhinitis: Secondary | ICD-10-CM | POA: Diagnosis not present

## 2023-02-14 DIAGNOSIS — H5203 Hypermetropia, bilateral: Secondary | ICD-10-CM | POA: Diagnosis not present

## 2023-03-07 ENCOUNTER — Encounter: Payer: Self-pay | Admitting: Physician Assistant

## 2023-03-07 ENCOUNTER — Ambulatory Visit: Payer: BC Managed Care – PPO | Admitting: Physician Assistant

## 2023-03-07 DIAGNOSIS — G4700 Insomnia, unspecified: Secondary | ICD-10-CM | POA: Diagnosis not present

## 2023-03-07 DIAGNOSIS — F1091 Alcohol use, unspecified, in remission: Secondary | ICD-10-CM

## 2023-03-07 DIAGNOSIS — F411 Generalized anxiety disorder: Secondary | ICD-10-CM

## 2023-03-07 MED ORDER — TRAZODONE HCL 100 MG PO TABS: 100.0000 mg | ORAL_TABLET | Freq: Every evening | ORAL | 1 refills | Status: AC | PRN

## 2023-03-07 NOTE — Progress Notes (Unsigned)
[facility]  Patient ID: ZI PLETT,  MRN: [id number]  PCP: Cleatis Polka., MD  Date of Evaluation: 03/07/2023 Time spent: 32 minutes  Chief Complaint:  Chief Complaint   Follow-up    HISTORY/CURRENT STATUS: HPI  For routine f/u.  It has been almost 6 months since [place] had an alcoholic beverage!  She again says she has not been sober since she was 15 until this year.  She is very grateful and doing well, not craving alcohol.  She drinks a lot of water, or flavored seltzer water occasionally.  She hopes to go off the acamprosate at some point.  Patient is able to enjoy things.  Energy and motivation are good.  Work is going well.   No extreme sadness, tearfulness, or feelings of hopelessness.  Sleeps well but she does need the trazodone or she cannot fall asleep and stay asleep.  ADLs and personal hygiene are normal.   Denies any changes in concentration, making decisions, or remembering things.  Appetite has not changed.  Weight is stable.  She will occasionally get anxious, it is more situational with work or something.  She is able to get through it without concern.  No palpitations, shortness of breath, or sweating.  Denies suicidal or homicidal thoughts.  Patient denies increased energy with decreased need for sleep, increased talkativeness, racing thoughts, impulsivity or risky behaviors, increased spending, increased libido, grandiosity, increased irritability or anger, paranoia, or hallucinations.  Denies dizziness, syncope, seizures, numbness, tingling, tremor, tics, unsteady gait, slurred speech, confusion. Denies muscle or joint pain, stiffness, or dystonia.  Individual Medical History/ Review of Systems: Changes? :No   Past medications for mental health diagnoses include: Wellbutrin for smoking cessation made her feel horrible, in 2020 her BF cheated on her and dog died and PCP Rx Klonopin for about a month, Melatonin   No psych hospitalizations.  No suicide attempts.  Allergies: Erythromycin  Current Medications:  Current Outpatient Medications:    acamprosate (CAMPRAL) 333 MG tablet, Take 2 tablets (666 mg total) by mouth 3 (three) times daily with meals., Disp: 180 tablet, Rfl: 5   ascorbic acid (VITAMIN C) 500 MG tablet, Take 500 mg by mouth daily., Disp: , Rfl:    co-enzyme Q-10 30 MG capsule, Take 30 mg by mouth 3 (three) times daily., Disp: , Rfl:    cyanocobalamin (VITAMIN B12) 500 MCG tablet, Take 500 mcg by mouth daily., Disp: , Rfl:    magnesium 30 MG tablet, Take 30 mg by mouth 2 (two) times daily., Disp: , Rfl:    Melatonin 3-10 MG TABS, Take by mouth., Disp: , Rfl:    Multiple Vitamin (MULTIVITAMIN) capsule, Take 1 capsule by mouth daily., Disp: , Rfl:    Omega-3 Fatty Acids (OMEGA 3 PO), Take by mouth., Disp: , Rfl:    Oyster Shell (OYSTER CALCIUM) 500 MG TABS tablet, Take 500 mg of elemental calcium by mouth 2 (two) times daily., Disp: , Rfl:    naltrexone (DEPADE) 50 MG tablet, Take 1 tablet (50 mg total) by mouth daily. (Patient not taking: Reported on 12/27/2022), Disp: 30 tablet, Rfl: 1   thiamine (VITAMIN B1) 100 MG tablet, Take 1 tablet (100 mg total) by mouth daily. (Patient not taking: Reported on 03/07/2023), Disp: 30 tablet, Rfl: 11   traZODone (DESYREL) 100 MG tablet, Take 1 tablet (100 mg total) by mouth at bedtime as needed for sleep., Disp: 90 tablet, Rfl: 1 Medication Side Effects: none  Family Medical/ Social History:  Changes? No  MENTAL HEALTH EXAM:  There were no vitals taken for this visit.There is no height or weight on file to calculate BMI.  General Appearance: Casual and Well Groomed  Eye Contact:  Good  Speech:  Clear and Coherent and Normal Rate  Volume:  Normal  Mood:  Euthymic  Affect:  Congruent  Thought Process:  Goal Directed and Descriptions of Associations: Circumstantial  Orientation:  Full (Time, Place, and Person)  Thought Content: Logical   Suicidal Thoughts:  No  Homicidal  Thoughts:  No  Memory:  WNL  Judgement:  Good  Insight:  Good  Psychomotor Activity:  Normal  Concentration:  Concentration: Good  Recall:  Good  Fund of Knowledge: Good  Language: Good  Assets:  Communication Skills Desire for Improvement Financial Resources/Insurance Housing Resilience Transportation Vocational/Educational  ADL's:  Intact  Cognition: WNL  Prognosis:  Good   Labs 01/23/2023 CBC with differential completely normal, platelets 216. CMP completely normal including LFTs  DIAGNOSES:    ICD-10-CM   1. Generalized anxiety disorder  F41.1     2. Alcohol use disorder in remission  F10.91     3. Insomnia, unspecified type  G47.00      Receiving Psychotherapy: Yes  with Kelton Pillar, [facility]  RECOMMENDATIONS:  PDMP reviewed.  Librium filled 09/30/2022. I provided 32  minutes of face to face time during this encounter, including time spent before and after the visit in records review, medical decision making, counseling pertinent to today's visit, and charting.   Markiya is doing great! No changes at this time. Discussed possibly going off acamprosate at some point, she doesn't want to be on it forever. I don't recommend stopping it until after the holidays.  She understands and agrees.  Continue acamprosate 333 mg, 2 p.o. 3 times daily. Take Naltrexone 50 mg, 1 po qd (prn) Continue trazodone 100 mg, 1/2-2 p.o. nightly as needed sleep. Continue vitamins as per med list.   Continue therapy with Kelton Pillar [facility]. Return in 2 months.  Melony Overly, PA-C

## 2023-03-20 DIAGNOSIS — S99922A Unspecified injury of left foot, initial encounter: Secondary | ICD-10-CM | POA: Diagnosis not present

## 2023-03-20 DIAGNOSIS — Z043 Encounter for examination and observation following other accident: Secondary | ICD-10-CM | POA: Diagnosis not present

## 2023-05-05 ENCOUNTER — Ambulatory Visit: Payer: BC Managed Care – PPO | Admitting: Physician Assistant

## 2023-05-22 ENCOUNTER — Ambulatory Visit: Payer: BC Managed Care – PPO | Admitting: Physician Assistant

## 2023-05-22 ENCOUNTER — Encounter: Payer: Self-pay | Admitting: Physician Assistant

## 2023-05-22 DIAGNOSIS — F411 Generalized anxiety disorder: Secondary | ICD-10-CM | POA: Diagnosis not present

## 2023-05-22 DIAGNOSIS — F1091 Alcohol use, unspecified, in remission: Secondary | ICD-10-CM

## 2023-05-22 DIAGNOSIS — G4700 Insomnia, unspecified: Secondary | ICD-10-CM

## 2023-05-22 MED ORDER — ACAMPROSATE CALCIUM 333 MG PO TBEC: 666.0000 mg | DELAYED_RELEASE_TABLET | Freq: Three times a day (TID) | ORAL | 5 refills | Status: DC

## 2023-05-22 NOTE — Progress Notes (Signed)
[facility]  Patient ID: Diana Leach,  MRN: [id number]  PCP: Cleatis Polka., MD  Date of Evaluation:05/22/2023 Time spent:20 minutes  Chief Complaint:  Chief Complaint   Follow-up    HISTORY/CURRENT STATUS: HPI  For routine f/u.  Namari has been under quite a bit of stress at work due to betrayal from her boss who is also her uncle. Even under this extreme stress she has not been tempted to drink any alcohol.  It has been 8 months now.  She is very proud of herself, understandably.  She takes the acamprosate faithfully.  She has the naltrexone for as needed use but has never taken it.  Patient is able to enjoy things.  Energy and motivation are good.   No extreme sadness, tearfulness, or feelings of hopelessness.  Sleeps well most of the time. ADLs and personal hygiene are normal.   Denies any changes in concentration, making decisions, or remembering things.  Appetite has not changed.  Weight is stable.   Denies suicidal or homicidal thoughts.  Denies dizziness, syncope, seizures, numbness, tingling, tremor, tics, unsteady gait, slurred speech, confusion. Denies muscle or joint pain, stiffness, or dystonia.  Individual Medical History/ Review of Systems: Changes? :No   Past medications for mental health diagnoses include: Wellbutrin for smoking cessation made her feel horrible, in 2020 her BF cheated on her and dog died and PCP Rx Klonopin for about a month, Melatonin   No psych hospitalizations. No suicide attempts.  Allergies: Erythromycin  Current Medications:  Current Outpatient Medications:    ascorbic acid (VITAMIN C) 500 MG tablet, Take 500 mg by mouth daily., Disp: , Rfl:    co-enzyme Q-10 30 MG capsule, Take 30 mg by mouth 3 (three) times daily., Disp: , Rfl:    cyanocobalamin (VITAMIN B12) 500 MCG tablet, Take 500 mcg by mouth daily., Disp: , Rfl:    magnesium 30 MG tablet, Take 30 mg by mouth 2 (two) times daily., Disp: , Rfl:    Multiple  Vitamin (MULTIVITAMIN) capsule, Take 1 capsule by mouth daily., Disp: , Rfl:    Omega-3 Fatty Acids (OMEGA 3 PO), Take by mouth., Disp: , Rfl:    Oyster Shell (OYSTER CALCIUM) 500 MG TABS tablet, Take 500 mg of elemental calcium by mouth 2 (two) times daily., Disp: , Rfl:    traZODone (DESYREL) 100 MG tablet, Take 1 tablet (100 mg total) by mouth at bedtime as needed for sleep., Disp: 90 tablet, Rfl: 1   acamprosate (CAMPRAL) 333 MG tablet, Take 2 tablets (666 mg total) by mouth 3 (three) times daily with meals., Disp: 180 tablet, Rfl: 5   Melatonin 3-10 MG TABS, Take by mouth. (Patient not taking: Reported on 05/22/2023), Disp: , Rfl:    naltrexone (DEPADE) 50 MG tablet, Take 1 tablet (50 mg total) by mouth daily. (Patient not taking: Reported on 12/27/2022), Disp: 30 tablet, Rfl: 1   thiamine (VITAMIN B1) 100 MG tablet, Take 1 tablet (100 mg total) by mouth daily. (Patient not taking: Reported on 03/07/2023), Disp: 30 tablet, Rfl: 11 Medication Side Effects: none  Family Medical/ Social History: Changes? No  MENTAL HEALTH EXAM:  There were no vitals taken for this visit.There is no height or weight on file to calculate BMI.  General Appearance: Casual and Well Groomed  Eye Contact:  Good  Speech:  Clear and Coherent and Normal Rate  Volume:  Normal  Mood:  Euthymic  Affect:  Congruent  Thought Process:  Goal Directed  and Descriptions of Associations: Circumstantial  Orientation:  Full (Time, Place, and Person)  Thought Content: Logical   Suicidal Thoughts:  No  Homicidal Thoughts:  No  Memory:  WNL  Judgement:  Good  Insight:  Good  Psychomotor Activity:  Normal  Concentration:  Concentration: Good  Recall:  Good  Fund of Knowledge: Good  Language: Good  Assets:  Communication Skills Desire for Improvement Financial Resources/Insurance Housing Resilience Social Support Transportation Vocational/Educational  ADL's:  Intact  Cognition: WNL  Prognosis:  Good    DIAGNOSES:     ICD-10-CM   1. Generalized anxiety disorder  F41.1     2. Alcohol use disorder in remission  F10.91     3. Insomnia, unspecified type  G47.00       Receiving Psychotherapy: Yes  with Kelton Pillar, [facility]  RECOMMENDATIONS:  PDMP reviewed.  Librium filled 09/30/2022. I provided 20 minutes of face to face time during this encounter, including time spent before and after the visit in records review, medical decision making, counseling pertinent to today's visit, and charting.   Congratulations on 8 months of sobriety!  She is doing great with her medications so no changes are necessary.  Continue acamprosate 333 mg, 2 p.o. 3 times daily. Take Naltrexone 50 mg, 1 po qd (prn) Continue trazodone 100 mg, 1/2-2 p.o. nightly as needed sleep. Continue vitamins as per med list.   Continue therapy with Kelton Pillar [facility]. Return in 3 months.  Melony Overly, PA-C

## 2023-08-01 ENCOUNTER — Telehealth: Payer: Self-pay | Admitting: Physician Assistant

## 2023-08-01 ENCOUNTER — Other Ambulatory Visit: Payer: Self-pay | Admitting: Physician Assistant

## 2023-08-01 MED ORDER — HYDROXYZINE HCL 10 MG PO TABS: 5.0000 mg | ORAL_TABLET | Freq: Three times a day (TID) | ORAL | 1 refills | Status: DC | PRN

## 2023-08-01 MED ORDER — SERTRALINE HCL 50 MG PO TABS: 50.0000 mg | ORAL_TABLET | Freq: Every day | ORAL | 1 refills | Status: DC

## 2023-08-01 NOTE — Telephone Encounter (Signed)
Diana Leach reports that she would like to start on an anxiety meds. She says that she is having a difficult time right now. She says that the increased stress at work that leaks over into her personal life.  She is no longer taking trazodone but is still taking acramprosate 333 mg.    Please advise.

## 2023-08-01 NOTE — Telephone Encounter (Signed)
I spoke with the patient.  In spite of everything that is going on, she has not had any alcohol.  Great news!  It will be a year on April 5. I recommend starting Zoloft and hydroxyzine.  The first is for prevention and the second as a rescue if she is feeling panicky.  Benefits, risks, and side effects of both were discussed and she accepts.  All questions were answered.  Prescriptions sent to [facility] on [address].   She has a follow-up appointment with me in 1 month.

## 2023-08-01 NOTE — Telephone Encounter (Signed)
Pt called at 9:45 inquiring if possible to get anxiety med sent in. Would be a new med. Had talked about previously. Next apt 3/10. Pharmacy [facility]. Contact @ [phone]

## 2023-08-28 ENCOUNTER — Ambulatory Visit (INDEPENDENT_AMBULATORY_CARE_PROVIDER_SITE_OTHER): Payer: BC Managed Care – PPO | Admitting: Physician Assistant

## 2023-08-28 ENCOUNTER — Encounter: Payer: Self-pay | Admitting: Physician Assistant

## 2023-08-28 DIAGNOSIS — F411 Generalized anxiety disorder: Secondary | ICD-10-CM

## 2023-08-28 DIAGNOSIS — G4700 Insomnia, unspecified: Secondary | ICD-10-CM | POA: Diagnosis not present

## 2023-08-28 DIAGNOSIS — Z87898 Personal history of other specified conditions: Secondary | ICD-10-CM

## 2023-08-28 MED ORDER — HYDROXYZINE HCL 25 MG PO TABS: 25.0000 mg | ORAL_TABLET | Freq: Three times a day (TID) | ORAL | 0 refills | Status: DC | PRN

## 2023-08-28 NOTE — Progress Notes (Signed)
 [facility]  Patient ID: Diana Leach,  MRN: [id number]  PCP: Cleatis Polka., MD  Date of Evaluation: 08/28/2023 Time spent:20 minutes  Chief Complaint:  Chief Complaint   Depression; Anxiety; Insomnia; Follow-up    HISTORY/CURRENT STATUS: HPI  Follow up   Zoloft was started approximately 1 month ago.  Please see phone note.  She is not having intrusive or ruminating thoughts like she was.  But she still feels overwhelmed.  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  The hydroxyzine has not helped the anxiety at all.  In fact she has not been able to feel it working, it does not cause dizziness or drowsiness.   Energy and motivation are good.  She has started working out several times a week.  No extreme sadness, tearfulness, or feelings of hopelessness.  She weaned herself off trazodone.  She can tell now that it was effective for quality of sleep.  She would rather not take it if at all possible though.  She has some if needed.  ADLs and personal hygiene are normal.   Denies any changes in concentration, making decisions, or remembering things.  Appetite has not changed.  Weight is stable.   Denies suicidal or homicidal thoughts.  Patient denies increased energy with decreased need for sleep, increased talkativeness, racing thoughts, impulsivity or risky behaviors, increased spending, increased libido, grandiosity, increased irritability or anger, paranoia, or hallucinations.  She has not had an alcoholic beverage in almost a year now.  She would like to go off a acamprosate at some point soon if it is okay.  Denies dizziness, syncope, seizures, numbness, tingling, tremor, tics, unsteady gait, slurred speech, confusion. Denies muscle or joint pain, stiffness, or dystonia.  Individual Medical History/ Review of Systems: Changes? :No   Past medications for mental health diagnoses include: Wellbutrin for smoking  cessation made her feel horrible, in 2020 her BF cheated on her and dog died and PCP Rx Klonopin for about a month, Melatonin   No psych hospitalizations. No suicide attempts.  Allergies: Erythromycin  Current Medications:  Current Outpatient Medications:    acamprosate (CAMPRAL) 333 MG tablet, Take 2 tablets (666 mg total) by mouth 3 (three) times daily with meals., Disp: 180 tablet, Rfl: 5   ascorbic acid (VITAMIN C) 500 MG tablet, Take 500 mg by mouth daily., Disp: , Rfl:    co-enzyme Q-10 30 MG capsule, Take 30 mg by mouth 3 (three) times daily., Disp: , Rfl:    cyanocobalamin (VITAMIN B12) 500 MCG tablet, Take 500 mcg by mouth daily., Disp: , Rfl:    hydrOXYzine (ATARAX) 25 MG tablet, Take 1 tablet (25 mg total) by mouth 3 (three) times daily as needed., Disp: 60 tablet, Rfl: 0   magnesium 30 MG tablet, Take 30 mg by mouth 2 (two) times daily., Disp: , Rfl:    Multiple Vitamin (MULTIVITAMIN) capsule, Take 1 capsule by mouth daily., Disp: , Rfl:    Omega-3 Fatty Acids (OMEGA 3 PO), Take by mouth., Disp: , Rfl:    Oyster Shell (OYSTER CALCIUM) 500 MG TABS tablet, Take 500 mg of elemental calcium by mouth 2 (two) times daily., Disp: , Rfl:    sertraline (ZOLOFT) 50 MG tablet, Take 1 tablet (50 mg total) by mouth daily., Disp: 30 tablet, Rfl: 1   thiamine (VITAMIN B1) 100 MG tablet, Take 1 tablet (100 mg total) by mouth daily., Disp: 30 tablet, Rfl:  11   traZODone (DESYREL) 100 MG tablet, Take 1 tablet (100 mg total) by mouth at bedtime as needed for sleep., Disp: 90 tablet, Rfl: 1   Melatonin 3-10 MG TABS, Take by mouth. (Patient not taking: Reported on 08/28/2023), Disp: , Rfl:    naltrexone (DEPADE) 50 MG tablet, Take 1 tablet (50 mg total) by mouth daily. (Patient not taking: Reported on 08/28/2023), Disp: 30 tablet, Rfl: 1 Medication Side Effects: none  Family Medical/ Social History: Changes? No  MENTAL HEALTH EXAM:  There were no vitals taken for this visit.There is no height or  weight on file to calculate BMI.  General Appearance: Casual and Well Groomed  Eye Contact:  Good  Speech:  Clear and Coherent and Normal Rate  Volume:  Normal  Mood:  Euthymic  Affect:  Congruent  Thought Process:  Goal Directed and Descriptions of Associations: Circumstantial  Orientation:  Full (Time, Place, and Person)  Thought Content: Logical   Suicidal Thoughts:  No  Homicidal Thoughts:  No  Memory:  WNL  Judgement:  Good  Insight:  Good  Psychomotor Activity:  Normal  Concentration:  Concentration: Good  Recall:  Good  Fund of Knowledge: Good  Language: Good  Assets:  Communication Skills Desire for Improvement Financial Resources/Insurance Housing Resilience Social Support Transportation Vocational/Educational  ADL's:  Intact  Cognition: WNL  Prognosis:  Good   DIAGNOSES:    ICD-10-CM   1. Generalized anxiety disorder  F41.1     2. Insomnia, unspecified type  G47.00     3. History of alcohol use disorder  Z87.898       Receiving Psychotherapy: Yes  with Kelton Pillar, [facility]  RECOMMENDATIONS:  PDMP reviewed.  Librium filled 09/30/2022. I provided 20 minutes of face to face time during this encounter, including time spent before and after the visit in records review, medical decision making, counseling pertinent to today's visit, and charting.   I recommend increasing the hydroxyzine dose.  She prefers to do that for now since it will be a as needed medicine.  She has only been on the Zoloft for a month now and it may work even more over the next few weeks so we decided to make no changes there.  If in a couple of weeks she is not at least 25% better then I recommend that she call and we will increase the dose to 75 or 100 mg.  As far as the a acamprosate, we will wait until the anxiety is better treated.  We will discuss it further at the next visit.    Continue acamprosate 333 mg, 2 p.o. 3 times daily. Increase hydroxyzine to 25 mg, 1 p.o. 3 times daily as  needed. Take Naltrexone 50 mg, 1 po qd (prn) Continue Zoloft 50 mg, 1 p.o. daily. Continue trazodone 100 mg, 1/2-2 p.o. nightly as needed sleep. Continue vitamins as per med list.   Continue therapy with Kelton Pillar [facility]. Return in 6 weeks.  Melony Overly, PA-C

## 2023-09-19 ENCOUNTER — Other Ambulatory Visit: Payer: Self-pay | Admitting: Physician Assistant

## 2023-10-05 ENCOUNTER — Telehealth: Payer: Self-pay | Admitting: Physician Assistant

## 2023-10-05 NOTE — Telephone Encounter (Signed)
 Pt Lvm @ 9:25a stating that she cannot get the Acamprosate.  It is out of stock nationwide and has been for a month.  She wants to know if there is another medication that can be prescribed.  Next appt 5/1

## 2023-10-05 NOTE — Telephone Encounter (Signed)
 Yes, it's 50 mg daily.

## 2023-10-05 NOTE — Telephone Encounter (Signed)
 LVM per DPR that 50 mg was the correct dose for naltrexone.

## 2023-10-05 NOTE — Telephone Encounter (Signed)
 Pt is prescribed Campral. Not sure if still being manufactured, but can't get it now. She said she was prescribed naltrexone 50 mg and has not taken it yet. She is asking if this is what you are wanting her to use for backup. I know pharmacies have recommended this in the past, unsure if the dose should be increased if she is taking as a stand alone.

## 2023-10-17 ENCOUNTER — Other Ambulatory Visit: Payer: Self-pay | Admitting: Physician Assistant

## 2023-10-19 ENCOUNTER — Ambulatory Visit: Admitting: Physician Assistant

## 2023-10-19 ENCOUNTER — Encounter: Payer: Self-pay | Admitting: Physician Assistant

## 2023-10-19 DIAGNOSIS — F411 Generalized anxiety disorder: Secondary | ICD-10-CM | POA: Diagnosis not present

## 2023-10-19 DIAGNOSIS — G4700 Insomnia, unspecified: Secondary | ICD-10-CM | POA: Diagnosis not present

## 2023-10-19 DIAGNOSIS — Z87898 Personal history of other specified conditions: Secondary | ICD-10-CM

## 2023-10-19 MED ORDER — SERTRALINE HCL 100 MG PO TABS: 100.0000 mg | ORAL_TABLET | Freq: Every day | ORAL | 1 refills | Status: DC

## 2023-10-19 NOTE — Progress Notes (Signed)
 [facility]  Patient ID: Diana Leach,  MRN: [id number]  PCP: Jeannine Milroy., MD  Date of Evaluation: 10/19/2023 Time spent:20 minutes  Chief Complaint:  Chief Complaint   Follow-up    HISTORY/CURRENT STATUS: HPI  Follow up   We increased hydroxyzine  at the LOV. It has helped more, she usually takes it only in the evening so she can relax to go to sleep.  Otherwise she has a hard time shutting her mind off.  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  She is handling it as well as possible.  She is not missing work.  Feels more anxious in general though.  Not having panic attacks, but does dread going to work and gets uptight easily.  Her main concern right now is the a acamprosate  being on backorder.  This has been an issue for over a month now.  Fortunately she had enough to get her to this point, however she will run out tomorrow.  She has been on a acamprosate  for a little over a year.  She has now been sober since 09/23/2022!  I prescribed naltrexone  sometime in the past year with instructions to take as needed, like if she would be in a situation where she would be even more tempted to drink.  She has not taking it at all, has been very thankful that the acamprosate  has prevented cravings.  Patient is able to enjoy things.  Energy and motivation are good.  No extreme sadness, tearfulness, or feelings of hopelessness.  Sleeps well most of the time.  ADLs and personal hygiene are normal.   Denies any changes in concentration, making decisions, or remembering things.  Appetite has not changed.  Weight is stable.   Denies suicidal or homicidal thoughts.  Patient denies increased energy with decreased need for sleep, increased talkativeness, racing thoughts, impulsivity or risky behaviors, increased spending, increased libido, grandiosity, increased irritability or anger, paranoia, or  hallucinations.  Denies dizziness, syncope, seizures, numbness, tingling, tremor, tics, unsteady gait, slurred speech, confusion. Denies muscle or joint pain, stiffness, or dystonia.  Individual Medical History/ Review of Systems: Changes? :No   Past medications for mental health diagnoses include: Wellbutrin for smoking cessation made her feel horrible, in 2020 her BF cheated on her and dog died and PCP Rx Klonopin for about a month, Melatonin   No psych hospitalizations. No suicide attempts.  Allergies: Erythromycin  Current Medications:  Current Outpatient Medications:    ascorbic acid (VITAMIN C) 500 MG tablet, Take 500 mg by mouth daily., Disp: , Rfl:    co-enzyme Q-10 30 MG capsule, Take 30 mg by mouth 3 (three) times daily., Disp: , Rfl:    cyanocobalamin (VITAMIN B12) 500 MCG tablet, Take 500 mcg by mouth daily., Disp: , Rfl:    hydrOXYzine  (ATARAX ) 25 MG tablet, Take 1 tablet (25 mg total) by mouth 3 (three) times daily as needed., Disp: 60 tablet, Rfl: 0   magnesium 30 MG tablet, Take 30 mg by mouth 2 (two) times daily., Disp: , Rfl:    Multiple Vitamin (MULTIVITAMIN) capsule, Take 1 capsule by mouth daily., Disp: , Rfl:    Omega-3 Fatty Acids (OMEGA 3 PO), Take by mouth., Disp: , Rfl:    Oyster Shell (OYSTER CALCIUM ) 500 MG TABS tablet, Take 500 mg of elemental calcium  by mouth 2 (two) times daily., Disp: , Rfl:    sertraline  (ZOLOFT ) 100  MG tablet, Take 1 tablet (100 mg total) by mouth daily., Disp: 30 tablet, Rfl: 1   thiamine  (VITAMIN B1) 100 MG tablet, Take 1 tablet (100 mg total) by mouth daily., Disp: 30 tablet, Rfl: 11   traZODone  (DESYREL ) 100 MG tablet, Take 1 tablet (100 mg total) by mouth at bedtime as needed for sleep., Disp: 90 tablet, Rfl: 1   Melatonin 3-10 MG TABS, Take by mouth. (Patient not taking: Reported on 05/22/2023), Disp: , Rfl:    naltrexone  (DEPADE) 50 MG tablet, Take 1 tablet (50 mg total) by mouth daily. (Patient not taking: Reported on 12/27/2022),  Disp: 30 tablet, Rfl: 1 Medication Side Effects: none  Family Medical/ Social History: Changes? No  MENTAL HEALTH EXAM:  There were no vitals taken for this visit.There is no height or weight on file to calculate BMI.  General Appearance: Casual and Well Groomed  Eye Contact:  Good  Speech:  Clear and Coherent and Normal Rate  Volume:  Normal  Mood:  Euthymic  Affect:  Congruent  Thought Process:  Goal Directed and Descriptions of Associations: Circumstantial  Orientation:  Full (Time, Place, and Person)  Thought Content: Logical   Suicidal Thoughts:  No  Homicidal Thoughts:  No  Memory:  WNL  Judgement:  Good  Insight:  Good  Psychomotor Activity:  Normal  Concentration:  Concentration: Good  Recall:  Good  Fund of Knowledge: Good  Language: Good  Assets:  Communication Skills Desire for Improvement Financial Resources/Insurance Housing Physical Health Resilience Social Support Transportation Vocational/Educational  ADL's:  Intact  Cognition: WNL  Prognosis:  Good   DIAGNOSES:    ICD-10-CM   1. History of alcohol use disorder  Z87.898     2. Generalized anxiety disorder  F41.1     3. Insomnia, unspecified type  G47.00      Receiving Psychotherapy: Yes  with Leanor Proper, [facility]  RECOMMENDATIONS:  PDMP reviewed.  Librium  filled 09/30/2022. I provided 20 minutes of face to face time during this encounter, including time spent before and after the visit in records review, medical decision making, counseling pertinent to today's visit, and charting.   Congratulations on 1 year sobriety!  We discussed the acamprosate  on backorder.  I asked her to call her pharmacy every week to see if it is available yet.  I also recommend that she start taking the naltrexone  every day.  Hopefully she will be able to get back on the a acamprosate  very soon.   Because of increased anxiety especially at work we decided to increase the Zoloft .  Pros and cons were  discussed.  Continue hydroxyzine  25 mg, 1 p.o. 3 times daily as needed. Start naltrexone  50 mg, 1 po every day. Increase Zoloft  to 100 mg daily. Continue trazodone  100 mg, 1/2-2 p.o. nightly as needed sleep. Continue vitamins as per med list.   Continue therapy with Leanor Proper [facility]. Return in 6-8 weeks.  Marvia Slocumb, PA-C

## 2023-12-14 ENCOUNTER — Encounter: Payer: Self-pay | Admitting: Physician Assistant

## 2023-12-14 ENCOUNTER — Other Ambulatory Visit: Payer: Self-pay | Admitting: Physician Assistant

## 2023-12-14 ENCOUNTER — Ambulatory Visit (INDEPENDENT_AMBULATORY_CARE_PROVIDER_SITE_OTHER): Admitting: Physician Assistant

## 2023-12-14 DIAGNOSIS — G4700 Insomnia, unspecified: Secondary | ICD-10-CM

## 2023-12-14 DIAGNOSIS — Z87898 Personal history of other specified conditions: Secondary | ICD-10-CM | POA: Diagnosis not present

## 2023-12-14 DIAGNOSIS — F4323 Adjustment disorder with mixed anxiety and depressed mood: Secondary | ICD-10-CM

## 2023-12-14 MED ORDER — SERTRALINE HCL 100 MG PO TABS: 100.0000 mg | ORAL_TABLET | Freq: Every day | ORAL | 1 refills | Status: DC

## 2023-12-14 MED ORDER — ACAMPROSATE CALCIUM 333 MG PO TBEC: 666.0000 mg | DELAYED_RELEASE_TABLET | Freq: Three times a day (TID) | ORAL | 1 refills | Status: DC

## 2023-12-14 MED ORDER — HYDROXYZINE HCL 25 MG PO TABS: 25.0000 mg | ORAL_TABLET | Freq: Three times a day (TID) | ORAL | 0 refills | Status: DC | PRN

## 2023-12-14 NOTE — Progress Notes (Signed)
 [facility]  Patient ID: Diana Leach,  MRN: [id number]  PCP: Loreli Elsie JONETTA Mickey., MD  Date of Evaluation: 12/14/2023 Time spent:20 minutes  Chief Complaint:  Chief Complaint   Anxiety; Depression; Follow-up    HISTORY/CURRENT STATUS: HPI  med check after increasing Zoloft   Six wks ago, we increased Zoloft .  It's been very helpful. She is able to enjoy things.  Energy and motivation are good.  Work is still very stressful, esp the past 2 weeks.   No extreme sadness, tearfulness, or feelings of hopelessness.  Sleeps well most of the time not needing Traz or melatonin often. ADLs and personal hygiene are normal.   Denies any changes in concentration, making decisions, or remembering things.  Appetite has not changed.  Weight is stable. Sober for over a year now. Gets anxious and overwhelmed at times, esp work-related but she's able to get through it. Hydroxyzine  helps. No mania, psychosis or delirium.  Denies suicidal or homicidal thoughts.  Denies dizziness, syncope, seizures, numbness, tingling, tremor, tics, unsteady gait, slurred speech, confusion. Denies muscle or joint pain, stiffness, or dystonia.  Individual Medical History/ Review of Systems: Changes? :No   Past medications for mental health diagnoses include: Wellbutrin for smoking cessation made her feel horrible, in 2020 her BF cheated on her and dog died and PCP Rx Klonopin for about a month, Melatonin   No psych hospitalizations. No suicide attempts.  Allergies: Erythromycin  Current Medications:  Current Outpatient Medications:    ascorbic acid (VITAMIN C) 500 MG tablet, Take 500 mg by mouth daily., Disp: , Rfl:    co-enzyme Q-10 30 MG capsule, Take 30 mg by mouth 3 (three) times daily., Disp: , Rfl:    cyanocobalamin (VITAMIN B12) 500 MCG tablet, Take 500 mcg by mouth daily., Disp: , Rfl:    magnesium 30 MG tablet, Take 30 mg by mouth 2 (two) times daily., Disp: , Rfl:    Multiple Vitamin  (MULTIVITAMIN) capsule, Take 1 capsule by mouth daily., Disp: , Rfl:    naltrexone  (DEPADE) 50 MG tablet, Take 1 tablet (50 mg total) by mouth daily. (Patient taking differently: Take 50 mg by mouth 3 (three) times daily.), Disp: 30 tablet, Rfl: 1   Omega-3 Fatty Acids (OMEGA 3 PO), Take by mouth., Disp: , Rfl:    Oyster Shell (OYSTER CALCIUM ) 500 MG TABS tablet, Take 500 mg of elemental calcium  by mouth 2 (two) times daily., Disp: , Rfl:    thiamine  (VITAMIN B1) 100 MG tablet, Take 1 tablet (100 mg total) by mouth daily., Disp: 30 tablet, Rfl: 11   acamprosate  (CAMPRAL ) 333 MG tablet, Take 2 tablets (666 mg total) by mouth 3 (three) times daily., Disp: 540 tablet, Rfl: 1   hydrOXYzine  (ATARAX ) 25 MG tablet, Take 1 tablet (25 mg total) by mouth 3 (three) times daily as needed., Disp: 60 tablet, Rfl: 0   Melatonin 3-10 MG TABS, Take by mouth. (Patient not taking: Reported on 05/22/2023), Disp: , Rfl:    sertraline  (ZOLOFT ) 100 MG tablet, Take 1 tablet (100 mg total) by mouth daily., Disp: 90 tablet, Rfl: 1   traZODone  (DESYREL ) 100 MG tablet, Take 1 tablet (100 mg total) by mouth at bedtime as needed for sleep. (Patient not taking: Reported on 12/14/2023), Disp: 90 tablet, Rfl: 1 Medication Side Effects: none  Family Medical/ Social History: Changes? No  MENTAL HEALTH EXAM:  There were no vitals taken for this visit.There is no height or weight on file to calculate BMI.  General Appearance: Casual and Well Groomed  Eye Contact:  Good  Speech:  Clear and Coherent and Normal Rate  Volume:  Normal  Mood:  Euthymic  Affect:  Congruent  Thought Process:  Goal Directed and Descriptions of Associations: Circumstantial  Orientation:  Full (Time, Place, and Person)  Thought Content: Logical   Suicidal Thoughts:  No  Homicidal Thoughts:  No  Memory:  WNL  Judgement:  Good  Insight:  Good  Psychomotor Activity:  Normal  Concentration:  Concentration: Good  Recall:  Good  Fund of Knowledge: Good   Language: Good  Assets:  Communication Skills Desire for Improvement Financial Resources/Insurance Housing Physical Health Resilience Transportation Vocational/Educational  ADL's:  Intact  Cognition: WNL  Prognosis:  Good   DIAGNOSES:    ICD-10-CM   1. Situational mixed anxiety and depressive disorder  F43.23     2. Insomnia, unspecified type  G47.00     3. History of alcohol use disorder  Z87.898       Receiving Psychotherapy: Yes  with Jessie Deussing, [facility]  RECOMMENDATIONS:  PDMP reviewed.  Librium  filled 09/30/2022. I provided approx 20   minutes of face to face time during this encounter, including time spent before and after the visit in records review, medical decision making, counseling pertinent to today's visit, and charting.   She is doing well w/ current meds so no changes are needed.   Cont Acamprosate  333 mg, 2 po tid.  Continue hydroxyzine  25 mg, 1 p.o. 3 times daily as needed. Take naltrexone  50 mg, 1 po every day prn. Continue Zoloft  100 mg daily. Continue trazodone  100 mg, 1/2-2 p.o. nightly as needed sleep. Continue vitamins as per med list.   Continue therapy with Albino Retort [facility]. Return in 3 months.  Verneita Cooks, PA-C

## 2024-03-08 ENCOUNTER — Other Ambulatory Visit: Payer: Self-pay | Admitting: Physician Assistant

## 2024-03-12 ENCOUNTER — Ambulatory Visit (INDEPENDENT_AMBULATORY_CARE_PROVIDER_SITE_OTHER): Admitting: Physician Assistant

## 2024-03-12 ENCOUNTER — Telehealth: Payer: Self-pay | Admitting: Physician Assistant

## 2024-03-12 DIAGNOSIS — F4323 Adjustment disorder with mixed anxiety and depressed mood: Secondary | ICD-10-CM

## 2024-03-12 MED ORDER — HYDROXYZINE HCL 25 MG PO TABS: 25.0000 mg | ORAL_TABLET | Freq: Three times a day (TID) | ORAL | 0 refills | Status: DC | PRN

## 2024-03-12 NOTE — Telephone Encounter (Signed)
 Patient called in for refill on Hydroxyzine  25mg  and Zoloft  100mg . Ph: [phone] Appt 10/27 Pharmacy [facility] [address] [place] CH[place]

## 2024-03-12 NOTE — Telephone Encounter (Signed)
 Hydroxyzine  sent. She has a RF available for Zoloft . She was notified.

## 2024-04-15 ENCOUNTER — Ambulatory Visit (INDEPENDENT_AMBULATORY_CARE_PROVIDER_SITE_OTHER): Admitting: Physician Assistant

## 2024-04-15 ENCOUNTER — Encounter: Payer: Self-pay | Admitting: Physician Assistant

## 2024-04-15 DIAGNOSIS — F4323 Adjustment disorder with mixed anxiety and depressed mood: Secondary | ICD-10-CM

## 2024-04-15 DIAGNOSIS — G4700 Insomnia, unspecified: Secondary | ICD-10-CM

## 2024-04-15 MED ORDER — HYDROXYZINE HCL 25 MG PO TABS: 25.0000 mg | ORAL_TABLET | Freq: Three times a day (TID) | ORAL | 1 refills | Status: DC | PRN

## 2024-04-15 MED ORDER — SERTRALINE HCL 100 MG PO TABS: 100.0000 mg | ORAL_TABLET | Freq: Every day | ORAL | 1 refills | Status: DC

## 2024-04-15 MED ORDER — ACAMPROSATE CALCIUM 333 MG PO TBEC: 666.0000 mg | DELAYED_RELEASE_TABLET | Freq: Three times a day (TID) | ORAL | 1 refills | Status: DC

## 2024-04-15 NOTE — Progress Notes (Unsigned)
 [facility]  Patient ID: Diana Leach,  MRN: [id number]  PCP: Loreli Elsie JONETTA Mickey., MD  Date of Evaluation: 04/15/2024 Time spent:20 minutes  Chief Complaint:  Chief Complaint   Anxiety; Depression; Insomnia; Follow-up    HISTORY/CURRENT STATUS: HPI  For routine med check.   She is under a lot of stress at work.  She is strongly considering resigning and looking for another position.  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 Diana Leach has a very strong faith and is depending on that to help make that decision and provide peace no matter what the future holds.  Even through all of this she has not had any temptation to drink alcohol.  It has been 1.5 years now since she has had a drink.  Continues to take the acamprosate  which continues to be helpful.    Patient is able to enjoy things.  Energy and motivation are good.  No extreme sadness, tearfulness, or feelings of hopelessness.  Sleeps well most of the time. ADLs and personal hygiene are normal.   Denies any changes in concentration, making decisions, or remembering things.  Appetite has not changed.  Weight is stable.  She sometimes gets anxious, hydroxyzine  is helpful when needed.  No mania, delirium, AH/VH.  No SI/HI.  Individual Medical History/ Review of Systems: Changes? :No   Past medications for mental health diagnoses include: Wellbutrin for smoking cessation made her feel horrible, in 2020 her BF cheated on her and dog died and PCP Rx Klonopin for about a month, Melatonin   No psych hospitalizations. No suicide attempts.  Allergies: Erythromycin  Current Medications:  Current Outpatient Medications:    ascorbic acid (VITAMIN C) 500 MG tablet, Take 500 mg by mouth daily., Disp: , Rfl:    co-enzyme Q-10 30 MG capsule, Take 30 mg by mouth 3 (three) times daily., Disp: , Rfl:    cyanocobalamin (VITAMIN B12)  500 MCG tablet, Take 500 mcg by mouth daily., Disp: , Rfl:    magnesium 30 MG tablet, Take 30 mg by mouth 2 (two) times daily., Disp: , Rfl:    Multiple Vitamin (MULTIVITAMIN) capsule, Take 1 capsule by mouth daily., Disp: , Rfl:    Omega-3 Fatty Acids (OMEGA 3 PO), Take by mouth., Disp: , Rfl:    Oyster Shell (OYSTER CALCIUM ) 500 MG TABS tablet, Take 500 mg of elemental calcium  by mouth 2 (two) times daily., Disp: , Rfl:    thiamine  (VITAMIN B1) 100 MG tablet, Take 1 tablet (100 mg total) by mouth daily., Disp: 30 tablet, Rfl: 11   traZODone  (DESYREL ) 100 MG tablet, Take 1 tablet (100 mg total) by mouth at bedtime as needed for sleep., Disp: 90 tablet, Rfl: 1   acamprosate  (CAMPRAL ) 333 MG tablet, Take 2 tablets (666 mg total) by mouth 3 (three) times daily., Disp: 540 tablet, Rfl: 1   hydrOXYzine  (ATARAX ) 25 MG tablet, Take 1 tablet (25 mg total) by mouth 3 (three) times daily as needed., Disp: 270 tablet, Rfl: 1   Melatonin 3-10 MG TABS, Take by mouth. (Patient not taking: Reported on 05/22/2023), Disp: , Rfl:    naltrexone  (DEPADE) 50 MG tablet, Take 1 tablet (50 mg total) by mouth daily. (Patient taking differently: Take 50 mg by mouth 3 (three) times daily.), Disp: 30 tablet, Rfl: 1   sertraline  (ZOLOFT ) 100 MG tablet, Take 1 tablet (100 mg total)  by mouth daily., Disp: 90 tablet, Rfl: 1 Medication Side Effects: none  Family Medical/ Social History: Changes?  See HPI  MENTAL HEALTH EXAM:  There were no vitals taken for this visit.There is no height or weight on file to calculate BMI.  General Appearance: Casual and Well Groomed  Eye Contact:  Good  Speech:  Clear and Coherent and Normal Rate  Volume:  Normal  Mood:  Euthymic  Affect:  Congruent  Thought Process:  Goal Directed and Descriptions of Associations: Circumstantial  Orientation:  Full (Time, Place, and Person)  Thought Content: Logical   Suicidal Thoughts:  No  Homicidal Thoughts:  No  Memory:  WNL  Judgement:  Good   Insight:  Good  Psychomotor Activity:  Normal  Concentration:  Concentration: Good  Recall:  Good  Fund of Knowledge: Good  Language: Good  Assets:  Communication Skills Desire for Improvement Financial Resources/Insurance Housing Physical Health Resilience Social Support Transportation Vocational/Educational  ADL's:  Intact  Cognition: WNL  Prognosis:  Good   DIAGNOSES:    ICD-10-CM   1. Insomnia, unspecified type  G47.00     2. Situational mixed anxiety and depressive disorder  F43.23 hydrOXYzine  (ATARAX ) 25 MG tablet     Receiving Psychotherapy: No  with Albino Retort, [facility] in the past.  RECOMMENDATIONS:  PDMP reviewed.  Librium  filled 09/30/2022. I provided approximately  20 minutes of face to face time during this encounter, including time spent before and after the visit in records review, medical decision making, counseling pertinent to today's visit, and charting.   As far as her medications go she is doing well so no changes will be made.   Cont Acamprosate  333 mg, 2 po tid.  Continue hydroxyzine  25 mg, 1 p.o. 3 times daily as needed. Take naltrexone  50 mg, 1 po every day prn. Continue Zoloft  100 mg daily. Continue trazodone  100 mg, 1/2-2 p.o. nightly as needed sleep. Continue vitamins as per med list.   Continue therapy with Jessie Deussing [facility] prn. Return in 3 months.  Verneita Cooks, PA-C

## 2024-07-22 ENCOUNTER — Encounter: Payer: Self-pay | Admitting: Physician Assistant

## 2024-07-22 ENCOUNTER — Telehealth: Admitting: Physician Assistant

## 2024-07-22 DIAGNOSIS — Z87898 Personal history of other specified conditions: Secondary | ICD-10-CM

## 2024-07-22 DIAGNOSIS — F4323 Adjustment disorder with mixed anxiety and depressed mood: Secondary | ICD-10-CM

## 2024-07-22 DIAGNOSIS — G4700 Insomnia, unspecified: Secondary | ICD-10-CM

## 2024-07-22 MED ORDER — ACAMPROSATE CALCIUM 333 MG PO TBEC: 666.0000 mg | DELAYED_RELEASE_TABLET | Freq: Two times a day (BID) | ORAL | Status: AC

## 2024-07-22 MED ORDER — SERTRALINE HCL 100 MG PO TABS: 150.0000 mg | ORAL_TABLET | Freq: Every day | ORAL | 1 refills | Status: AC

## 2024-07-22 MED ORDER — HYDROXYZINE HCL 10 MG PO TABS: 10.0000 mg | ORAL_TABLET | Freq: Four times a day (QID) | ORAL | 5 refills | Status: AC | PRN
